# Patient Record
Sex: Female | Born: 1945 | Race: Black or African American | Hispanic: No | Marital: Married | Smoking: Former smoker
Health system: Southern US, Community
[De-identification: ages and names within clinical notes are randomized; demographics above are authoritative.]

## PROBLEM LIST (undated history)

## (undated) DIAGNOSIS — D569 Thalassemia, unspecified: Secondary | ICD-10-CM

## (undated) DIAGNOSIS — Z5189 Encounter for other specified aftercare: Secondary | ICD-10-CM

## (undated) DIAGNOSIS — E7800 Pure hypercholesterolemia, unspecified: Secondary | ICD-10-CM

## (undated) DIAGNOSIS — I10 Essential (primary) hypertension: Secondary | ICD-10-CM

## (undated) HISTORY — PX: BACK SURGERY: SHX140

## (undated) HISTORY — PX: CATARACT EXTRACTION: SUR2

---

## 2011-07-06 DIAGNOSIS — I671 Cerebral aneurysm, nonruptured: Secondary | ICD-10-CM | POA: Insufficient documentation

## 2012-09-20 DIAGNOSIS — K219 Gastro-esophageal reflux disease without esophagitis: Secondary | ICD-10-CM | POA: Insufficient documentation

## 2013-05-11 DIAGNOSIS — D638 Anemia in other chronic diseases classified elsewhere: Secondary | ICD-10-CM | POA: Insufficient documentation

## 2013-05-11 DIAGNOSIS — I10 Essential (primary) hypertension: Secondary | ICD-10-CM | POA: Insufficient documentation

## 2014-04-21 DIAGNOSIS — F419 Anxiety disorder, unspecified: Secondary | ICD-10-CM | POA: Insufficient documentation

## 2018-02-15 ENCOUNTER — Encounter (HOSPITAL_BASED_OUTPATIENT_CLINIC_OR_DEPARTMENT_OTHER): Payer: Self-pay | Admitting: Emergency Medicine

## 2018-02-15 ENCOUNTER — Emergency Department (HOSPITAL_BASED_OUTPATIENT_CLINIC_OR_DEPARTMENT_OTHER): Payer: Medicare Other

## 2018-02-15 ENCOUNTER — Emergency Department (HOSPITAL_BASED_OUTPATIENT_CLINIC_OR_DEPARTMENT_OTHER)
Admission: EM | Admit: 2018-02-15 | Discharge: 2018-02-15 | Disposition: A | Discharge status: Home / Self Care | Payer: Medicare Other | Attending: Emergency Medicine | Admitting: Emergency Medicine

## 2018-02-15 ENCOUNTER — Other Ambulatory Visit: Payer: Self-pay

## 2018-02-15 DIAGNOSIS — I10 Essential (primary) hypertension: Secondary | ICD-10-CM | POA: Insufficient documentation

## 2018-02-15 DIAGNOSIS — M79605 Pain in left leg: Secondary | ICD-10-CM | POA: Diagnosis present

## 2018-02-15 DIAGNOSIS — Z87891 Personal history of nicotine dependence: Secondary | ICD-10-CM | POA: Diagnosis not present

## 2018-02-15 DIAGNOSIS — M25562 Pain in left knee: Secondary | ICD-10-CM | POA: Diagnosis not present

## 2018-02-15 DIAGNOSIS — Z7982 Long term (current) use of aspirin: Secondary | ICD-10-CM | POA: Insufficient documentation

## 2018-02-15 HISTORY — DX: Encounter for other specified aftercare: Z51.89

## 2018-02-15 HISTORY — DX: Pure hypercholesterolemia, unspecified: E78.00

## 2018-02-15 HISTORY — DX: Thalassemia, unspecified: D56.9

## 2018-02-15 HISTORY — DX: Essential (primary) hypertension: I10

## 2018-02-15 MED ORDER — IBUPROFEN 200 MG PO TABS
200.0000 mg | ORAL_TABLET | Freq: Once | ORAL | Status: AC
  Administered 2018-02-15: 200 mg via ORAL
  Filled 2018-02-15: qty 1

## 2018-02-15 MED ORDER — ACETAMINOPHEN 325 MG PO TABS
650.0000 mg | ORAL_TABLET | Freq: Once | ORAL | Status: AC
  Administered 2018-02-15: 650 mg via ORAL
  Filled 2018-02-15: qty 2

## 2018-02-15 NOTE — ED Provider Notes (Signed)
[facility] EMERGENCY DEPARTMENT Provider Note   CSN: [id number] Arrival date & time: 02/15/18  1108     History   Chief Complaint Chief Complaint  Patient presents with  . Leg Pain    HPI Allison Schmidt is a 72 y.o. female.  Patient c/o left knee pain for the past few days. Pain constant, dull, moderate. With certain movements of knee, weight bearing the pain will shoot up and down. Denies leg or calf swelling. No skin changes, redness, or lesions. Denies low back or radicular pain. No hip pain. Denies fever or chills. Denies specific injury or strain. ?hx arthritis.   The history is provided by the patient.  Leg Pain   Pertinent negatives include no numbness.    Past Medical History:  Diagnosis Date  . Blood transfusion without reported diagnosis   . High cholesterol   . Hypertension   . Thalassemia     There are no active problems to display for this patient.   Past Surgical History:  Procedure Laterality Date  . BACK SURGERY    . CATARACT EXTRACTION       OB History   None      Home Medications    Prior to Admission medications   Medication Sig Start Date End Date Taking? Authorizing Provider  aspirin 81 MG chewable tablet Chew 81 mg by mouth daily.   Yes [provider]    Family History History reviewed. No pertinent family history.  Social History Social History   Tobacco Use  . Smoking status: Former Smoker    Types: Cigarettes  . Smokeless tobacco: Never Used  Substance Use Topics  . Alcohol use: Not Currently  . Drug use: Never     Allergies   Codeine   Review of Systems Review of Systems  Constitutional: Negative for fever.  Respiratory: Negative for shortness of breath.   Cardiovascular: Negative for chest pain.  Gastrointestinal: Negative for abdominal pain.  Musculoskeletal: Negative for back pain.  Skin: Negative for rash.  Neurological: Negative for weakness and numbness.     Physical Exam Updated  Vital Signs BP (!) 146/69 (BP Location: Left Arm)   Pulse (!) 59   Temp 97.8 F (36.6 C) (Oral)   Resp 18   Ht 1.676 m (5\' 6" )   Wt 59 kg   SpO2 99%   BMI 20.98 kg/m   Physical Exam  Constitutional: She appears well-developed and well-nourished.  HENT:  Head: Atraumatic.  Eyes: Conjunctivae are normal. No scleral icterus.  Neck: Neck supple. No tracheal deviation present.  Cardiovascular: Normal rate and intact distal pulses.  Pulmonary/Chest: Effort normal. No respiratory distress.  Abdominal: Normal appearance.  Musculoskeletal: She exhibits no edema.  Left knee w mild tenderness anteriorly and laterally. No effusion. Knee grossly stable. Some crepitus/clicking w rom left knee. No leg/calf swelling. Dp/pt 2+. Good passive  rom left hip without pain.   Neurological: She is alert.  Speech normal. Steady gait. LLE motor/sens grossly intact.   Skin: Skin is warm and dry. No rash noted.  Psychiatric: She has a normal mood and affect.  Nursing note and vitals reviewed.    ED Treatments / Results  Labs (all labs ordered are listed, but only abnormal results are displayed) Labs Reviewed - No data to display  EKG None  Radiology Dg Knee Complete 4 Views Left  Result Date: 02/15/2018 CLINICAL DATA:  Left knee and leg pain for the past 3 days. EXAM: LEFT KNEE - COMPLETE  4+ VIEW COMPARISON:  None. FINDINGS: No acute fracture or dislocation. No joint effusion. Minimal medial compartment joint space narrowing. Vascular calcifications. Soft tissues are unremarkable. IMPRESSION: No acute osseous abnormality or significant degenerative changes. Electronically Signed   By: Obie Dredge M.D.   On: 02/15/2018 12:35    Procedures Procedures (including critical care time)  Medications Ordered in ED Medications - No data to display   Initial Impression / Assessment and Plan / ED Course  I have reviewed the triage vital signs and the nursing notes.  Pertinent labs & imaging  results that were available during my care of the patient were reviewed by me and considered in my medical decision making (see chart for details).  Xrays.   Reviewed nursing notes and prior charts for additional history.   No meds pta.  Ibuprofen po. Acetaminophen po.  xrays reviewed - mild deg changes, neg acute.   Pt currently appears stable for d/c.  rec close pcp f/u.    Final Clinical Impressions(s) / ED Diagnoses   Final diagnoses:  None    ED Discharge Orders    None       Cathren Laine, MD 02/15/18 1255

## 2018-02-15 NOTE — ED Notes (Signed)
Patient transported to Ultrasound 

## 2018-02-15 NOTE — ED Triage Notes (Signed)
L leg pain behind the knee and up into the thigh x 3 days. Denies injury.

## 2018-02-15 NOTE — Discharge Instructions (Addendum)
It was our pleasure to provide your ER care today - we hope that you feel better.  Take acetaminophen and/or ibuprofen as need for pain.  Follow up with primary care doctor in 1 week.  Your blood pressure is high today - have your blood pressure rechecked at  follow up with primary care doctor.   Return to ER if worse, new symptoms, fevers, redness/swelling, other concern.

## 2020-11-09 DIAGNOSIS — G514 Facial myokymia: Secondary | ICD-10-CM | POA: Insufficient documentation

## 2021-03-10 ENCOUNTER — Emergency Department (HOSPITAL_BASED_OUTPATIENT_CLINIC_OR_DEPARTMENT_OTHER)
Admission: EM | Admit: 2021-03-10 | Discharge: 2021-03-10 | Disposition: A | Discharge status: Home / Self Care | Payer: Medicare Other | Attending: Emergency Medicine | Admitting: Emergency Medicine

## 2021-03-10 ENCOUNTER — Other Ambulatory Visit: Payer: Self-pay

## 2021-03-10 ENCOUNTER — Encounter (HOSPITAL_BASED_OUTPATIENT_CLINIC_OR_DEPARTMENT_OTHER): Payer: Self-pay | Admitting: *Deleted

## 2021-03-10 DIAGNOSIS — Z79899 Other long term (current) drug therapy: Secondary | ICD-10-CM | POA: Insufficient documentation

## 2021-03-10 DIAGNOSIS — Z7982 Long term (current) use of aspirin: Secondary | ICD-10-CM | POA: Diagnosis not present

## 2021-03-10 DIAGNOSIS — I10 Essential (primary) hypertension: Secondary | ICD-10-CM | POA: Diagnosis not present

## 2021-03-10 DIAGNOSIS — M542 Cervicalgia: Secondary | ICD-10-CM | POA: Diagnosis present

## 2021-03-10 DIAGNOSIS — Z87891 Personal history of nicotine dependence: Secondary | ICD-10-CM | POA: Insufficient documentation

## 2021-03-10 DIAGNOSIS — M25511 Pain in right shoulder: Secondary | ICD-10-CM | POA: Insufficient documentation

## 2021-03-10 DIAGNOSIS — M5412 Radiculopathy, cervical region: Secondary | ICD-10-CM | POA: Diagnosis not present

## 2021-03-10 MED ORDER — DOCUSATE SODIUM 100 MG PO CAPS: 100.0000 mg | ORAL_CAPSULE | Freq: Two times a day (BID) | ORAL | 0 refills | Status: AC

## 2021-03-10 MED ORDER — DIAZEPAM 5 MG/ML IJ SOLN
2.5000 mg | Freq: Once | INTRAMUSCULAR | Status: AC
  Administered 2021-03-10: 2.5 mg via INTRAVENOUS
  Filled 2021-03-10: qty 2

## 2021-03-10 MED ORDER — DEXAMETHASONE SODIUM PHOSPHATE 10 MG/ML IJ SOLN
6.0000 mg | Freq: Once | INTRAMUSCULAR | Status: AC
  Administered 2021-03-10: 6 mg via INTRAVENOUS
  Filled 2021-03-10: qty 1

## 2021-03-10 MED ORDER — FENTANYL CITRATE PF 50 MCG/ML IJ SOSY
50.0000 ug | PREFILLED_SYRINGE | Freq: Once | INTRAMUSCULAR | Status: AC
  Administered 2021-03-10: 50 ug via INTRAVENOUS
  Filled 2021-03-10: qty 1

## 2021-03-10 MED ORDER — HYDROCODONE-ACETAMINOPHEN 5-325 MG PO TABS: 1.0000 | ORAL_TABLET | Freq: Four times a day (QID) | ORAL | 0 refills | Status: DC | PRN

## 2021-03-10 NOTE — ED Triage Notes (Signed)
She woke with pain in her her right shoulder and right side of her neck. Limited movement. No known injury.

## 2021-03-10 NOTE — ED Provider Notes (Signed)
[facility] Provider Note   CSN: [id number] Arrival date & time: 03/10/21  1645     History Chief Complaint  Patient presents with   Shoulder Pain    Allison Schmidt is a 75 y.o. female.  Patient is a 75 year old female who presents with right-sided neck pain.  She says she woke up this morning with pain in the right side of her neck.  It radiates down to her right shoulder and a little bit down her right arm.  Also a little bit down her right side.  She has some intermittent tingling in her right hand.  No known weakness.  She says it hurts with movement.  She denies any recent injuries to her neck.  No history of similar symptoms in the past.  No associated chest pain or shortness of breath.  She says its been persistent throughout the day.  She tried some heating pads without improvement.      Past Medical History:  Diagnosis Date   Blood transfusion without reported diagnosis    High cholesterol    Hypertension    Thalassemia     There are no problems to display for this patient.   Past Surgical History:  Procedure Laterality Date   BACK SURGERY     CATARACT EXTRACTION       OB History   No obstetric history on file.     No family history on file.  Social History   Tobacco Use   Smoking status: Former    Types: Cigarettes   Smokeless tobacco: Never  Substance Use Topics   Alcohol use: Not Currently   Drug use: Never    Home Medications Prior to Admission medications   Medication Sig Start Date End Date Taking? Authorizing Provider  albuterol (VENTOLIN HFA) 108 (90 Base) MCG/ACT inhaler Inhale into the lungs. 01/12/21 01/12/22 Yes [provider]  ALPRAZolam Duanne Moron) 0.5 MG tablet Take one tablet on way to exam for anxiety. May repeat X 1 if still having anxiety. 08/29/20  Yes [provider]  azelastine (ASTELIN) 0.1 % nasal spray Place into the nose. 10/05/19  Yes [provider]   cetirizine-pseudoephedrine (ZYRTEC-D) 5-120 MG tablet Take by mouth. 10/05/19  Yes [provider]  docusate sodium (COLACE) 100 MG capsule Take 1 capsule (100 mg total) by mouth every 12 (twelve) hours. 03/10/21  Yes Malvin Johns, MD  ferrous sulfate 325 (65 FE) MG tablet Take 1 tablet by mouth daily with breakfast. 08/25/20  Yes [provider]  fluticasone (FLONASE) 50 MCG/ACT nasal spray Place into the nose. 04/29/20  Yes [provider]  gabapentin (NEURONTIN) 400 MG capsule Take 1 capsule by mouth 3 (three) times daily. 08/07/16  Yes [provider]  HYDROcodone-acetaminophen (NORCO/VICODIN) 5-325 MG tablet Take 1 tablet by mouth every 6 (six) hours as needed. 03/10/21  Yes Malvin Johns, MD  LORazepam (ATIVAN) 1 MG tablet 1 or 2 for MRI 11/09/20  Yes [provider]  losartan (COZAAR) 50 MG tablet Take 1 tablet by mouth daily. 10/03/20  Yes [provider]  montelukast (SINGULAIR) 10 MG tablet Take 1 tablet by mouth at bedtime. 10/01/16  Yes [provider]  mupirocin ointment (BACTROBAN) 2 % APPLY TO AFFECTED AREA TWICE DAILY TO NASAL PASSAGE FOR CRUSTING AS NEEDED 12/30/20  Yes [provider]  omeprazole (PRILOSEC) 40 MG capsule TAKE 1 CAPSULE BY MOUTH ONCE DAILY 30 MINUTES BEFORE BREAKFAST 09/17/16  Yes [provider]  potassium chloride  SA (KLOR-CON) 20 MEQ tablet Take 1 tablet by mouth daily. 11/12/16  Yes [provider]  simvastatin (ZOCOR) 40 MG tablet TAKE 1 TABLET BY MOUTH ONCE DAILY NIGHTLY 12/13/16  Yes [provider]  aspirin 81 MG chewable tablet Chew 81 mg by mouth daily.    [provider]  cinacalcet (SENSIPAR) 30 MG tablet Take by mouth.    [provider]  Multiple Vitamins-Minerals (CENTRAVITES) TABS Take 1 tablet by mouth daily.    [provider]  psyllium (KONSYL) 33 % POWD Take by mouth.    [provider]    Allergies     Codeine  Review of Systems   Review of Systems  Constitutional:  Negative for chills, diaphoresis, fatigue and fever.  HENT:  Negative for congestion, rhinorrhea and sneezing.   Eyes: Negative.   Respiratory:  Negative for cough, chest tightness and shortness of breath.   Cardiovascular:  Negative for chest pain and leg swelling.  Gastrointestinal:  Negative for abdominal pain, blood in stool, diarrhea, nausea and vomiting.  Genitourinary:  Negative for difficulty urinating, flank pain, frequency and hematuria.  Musculoskeletal:  Positive for neck pain. Negative for arthralgias and back pain.  Skin:  Negative for rash.  Neurological:  Negative for dizziness, speech difficulty, weakness, numbness and headaches.   Physical Exam Updated Vital Signs BP (!) 167/65   Pulse 68   Temp 98.1 F (36.7 C) (Oral)   Resp 18   Ht 5\' 6"  (1.676 m)   Wt 49.4 kg   SpO2 94%   BMI 17.59 kg/m   Physical Exam Constitutional:      Appearance: She is well-developed.  HENT:     Head: Normocephalic and atraumatic.  Eyes:     Pupils: Pupils are equal, round, and reactive to light.  Neck:     Comments: Positive tenderness over the right paracervical muscles and over the right trapezius muscle.  There is no specific tenderness over the right shoulder.  Radial pulses are intact.  She has normal sensation and motor function to the hand.  No swelling noted. Cardiovascular:     Rate and Rhythm: Normal rate and regular rhythm.     Heart sounds: Normal heart sounds.  Pulmonary:     Effort: Pulmonary effort is normal. No respiratory distress.     Breath sounds: Normal breath sounds. No wheezing or rales.  Chest:     Chest wall: No tenderness.  Abdominal:     General: Bowel sounds are normal.     Palpations: Abdomen is soft.     Tenderness: There is no abdominal tenderness. There is no guarding or rebound.  Musculoskeletal:        General: Normal range of motion.  Lymphadenopathy:     Cervical: No  cervical adenopathy.  Skin:    General: Skin is warm and dry.     Findings: No rash.  Neurological:     Mental Status: She is alert and oriented to person, place, and time.    ED Results / Procedures / Treatments   Labs (all labs ordered are listed, but only abnormal results are displayed) Labs Reviewed - No data to display  EKG None  Radiology No results found.  Procedures Procedures   Medications Ordered in ED Medications  fentaNYL (SUBLIMAZE) injection 50 mcg (50 mcg Intravenous Given by Other 03/10/21 1840)  dexamethasone (DECADRON) injection 6 mg (6 mg Intravenous Given by Other 03/10/21 1839)  diazepam (VALIUM) injection 2.5 mg (2.5 mg Intravenous  Given by Other 03/10/21 1840)    ED Course  I have reviewed the triage vital signs and the nursing notes.  Pertinent labs & imaging results that were available during my care of the patient were reviewed by me and considered in my medical decision making (see chart for details).    MDM Rules/Calculators/A&P                           Patient is a 75 year old female who presents with neck pain since she woke up this morning.  It is all on the right side.  Has some radiation into her right arm.  She has some tingling in her hand but no numbness or weakness on exam.  She does have some twitching of the left face and some drooping of the left face which is chronic.  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  She does not have any numbness or weakness on the right side.  No vision changes.  No headache.  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  However she does not have any symptoms today to suggest an issue with her aneurysm.  She had an MRI of the brain with contrast in May of this year which showed no evidence of aneurysms.  This was not specifically an MRA.  She denies any headache.  Her neck pain is paraspinal.  Seems to be radicular in nature.  There is no  neurologic deficits.  She feels better after treatment in the ED with Valium and fentanyl.  She is able to move it more.  She was also given a dose of Decadron.  She was discharged home in good condition.  She was given a prescription for small amount of Vicodin.  She was advised to follow-up with her doctor on Monday for recheck.  Return precautions were given. Final Clinical Impression(s) / ED Diagnoses Final diagnoses:  Cervical radiculopathy    Rx / DC Orders ED Discharge Orders          Ordered    HYDROcodone-acetaminophen (NORCO/VICODIN) 5-325 MG tablet  Every 6 hours PRN        03/10/21 2058    docusate sodium (COLACE) 100 MG capsule  Every 12 hours        03/10/21 2058             Rolan Bucco, MD 03/10/21 2105

## 2021-03-13 ENCOUNTER — Other Ambulatory Visit: Payer: Self-pay

## 2021-03-13 ENCOUNTER — Encounter (HOSPITAL_BASED_OUTPATIENT_CLINIC_OR_DEPARTMENT_OTHER): Payer: Self-pay | Admitting: *Deleted

## 2021-03-13 ENCOUNTER — Emergency Department (HOSPITAL_BASED_OUTPATIENT_CLINIC_OR_DEPARTMENT_OTHER)
Admission: EM | Admit: 2021-03-13 | Discharge: 2021-03-13 | Disposition: A | Discharge status: Home / Self Care | Payer: Medicare Other | Attending: Emergency Medicine | Admitting: Emergency Medicine

## 2021-03-13 ENCOUNTER — Emergency Department (HOSPITAL_BASED_OUTPATIENT_CLINIC_OR_DEPARTMENT_OTHER): Payer: Medicare Other

## 2021-03-13 DIAGNOSIS — I10 Essential (primary) hypertension: Secondary | ICD-10-CM | POA: Insufficient documentation

## 2021-03-13 DIAGNOSIS — R0789 Other chest pain: Secondary | ICD-10-CM | POA: Diagnosis not present

## 2021-03-13 DIAGNOSIS — M25511 Pain in right shoulder: Secondary | ICD-10-CM | POA: Insufficient documentation

## 2021-03-13 DIAGNOSIS — Z87891 Personal history of nicotine dependence: Secondary | ICD-10-CM | POA: Diagnosis not present

## 2021-03-13 DIAGNOSIS — M5412 Radiculopathy, cervical region: Secondary | ICD-10-CM | POA: Insufficient documentation

## 2021-03-13 DIAGNOSIS — Z79899 Other long term (current) drug therapy: Secondary | ICD-10-CM | POA: Diagnosis not present

## 2021-03-13 DIAGNOSIS — Z7982 Long term (current) use of aspirin: Secondary | ICD-10-CM | POA: Insufficient documentation

## 2021-03-13 DIAGNOSIS — M542 Cervicalgia: Secondary | ICD-10-CM | POA: Diagnosis present

## 2021-03-13 LAB — COMPREHENSIVE METABOLIC PANEL
ALT: 13 U/L (ref 0–44)
AST: 22 U/L (ref 15–41)
Albumin: 3.5 g/dL (ref 3.5–5.0)
Alkaline Phosphatase: 61 U/L (ref 38–126)
Anion gap: 9 (ref 5–15)
BUN: 15 mg/dL (ref 8–23)
CO2: 24 mmol/L (ref 22–32)
Calcium: 9.2 mg/dL (ref 8.9–10.3)
Chloride: 98 mmol/L (ref 98–111)
Creatinine, Ser: 0.87 mg/dL (ref 0.44–1.00)
GFR, Estimated: >60 mL/min (ref >60)
Glucose, Bld: 101 mg/dL — ABNORMAL HIGH (ref 70–99)
Potassium: 3.7 mmol/L (ref 3.5–5.1)
Sodium: 131 mmol/L — ABNORMAL LOW (ref 135–145)
Total Bilirubin: 0.7 mg/dL (ref 0.3–1.2)
Total Protein: 9 g/dL — ABNORMAL HIGH (ref 6.5–8.1)

## 2021-03-13 LAB — CBC WITH DIFFERENTIAL/PLATELET
Abs Immature Granulocytes: 0.05 10*3/uL (ref 0.00–0.07)
Basophils Absolute: 0 10*3/uL (ref 0.0–0.1)
Basophils Relative: 0 %
Eosinophils Absolute: 0 10*3/uL (ref 0.0–0.5)
Eosinophils Relative: 0 %
HCT: 31.6 % — ABNORMAL LOW (ref 36.0–46.0)
Hemoglobin: 10.5 g/dL — ABNORMAL LOW (ref 12.0–15.0)
Immature Granulocytes: 0 %
Lymphocytes Relative: 7 %
Lymphs Abs: 0.8 10*3/uL (ref 0.7–4.0)
MCH: 22.9 pg — ABNORMAL LOW (ref 26.0–34.0)
MCHC: 33.2 g/dL (ref 30.0–36.0)
MCV: 68.8 fL — ABNORMAL LOW (ref 80.0–100.0)
Monocytes Absolute: 1.1 10*3/uL — ABNORMAL HIGH (ref 0.1–1.0)
Monocytes Relative: 9 %
Neutro Abs: 10.1 10*3/uL — ABNORMAL HIGH (ref 1.7–7.7)
Neutrophils Relative %: 84 %
Platelets: 184 10*3/uL (ref 150–400)
RBC: 4.59 MIL/uL (ref 3.87–5.11)
RDW: 15.8 % — ABNORMAL HIGH (ref 11.5–15.5)
WBC: 12.1 10*3/uL — ABNORMAL HIGH (ref 4.0–10.5)
nRBC: 0 % (ref 0.0–0.2)

## 2021-03-13 LAB — TROPONIN I (HIGH SENSITIVITY): Troponin I (High Sensitivity): 5 ng/L (ref <18)

## 2021-03-13 LAB — MAGNESIUM: Magnesium: 1.7 mg/dL (ref 1.7–2.4)

## 2021-03-13 MED ORDER — KETOROLAC TROMETHAMINE 15 MG/ML IJ SOLN
15.0000 mg | Freq: Once | INTRAMUSCULAR | Status: AC
  Administered 2021-03-13: 11:00:00 15 mg via INTRAVENOUS
  Filled 2021-03-13: qty 1

## 2021-03-13 MED ORDER — MORPHINE SULFATE (PF) 4 MG/ML IV SOLN
4.0000 mg | Freq: Once | INTRAVENOUS | Status: AC
  Administered 2021-03-13: 11:00:00 4 mg via INTRAVENOUS
  Filled 2021-03-13: qty 1

## 2021-03-13 MED ORDER — LACTATED RINGERS IV BOLUS
500.0000 mL | Freq: Once | INTRAVENOUS | Status: AC
  Administered 2021-03-13: 11:00:00 500 mL via INTRAVENOUS

## 2021-03-13 MED ORDER — MELOXICAM 7.5 MG PO TABS: 7.5000 mg | ORAL_TABLET | Freq: Every day | ORAL | 0 refills | Status: AC

## 2021-03-13 MED ORDER — IOHEXOL 350 MG/ML SOLN
100.0000 mL | Freq: Once | INTRAVENOUS | Status: AC | PRN
  Administered 2021-03-13: 11:00:00 100 mL via INTRAVENOUS

## 2021-03-13 MED ORDER — METHOCARBAMOL 500 MG PO TABS: 500.0000 mg | ORAL_TABLET | Freq: Two times a day (BID) | ORAL | 0 refills | Status: AC

## 2021-03-13 NOTE — ED Provider Notes (Signed)
[facility]ENCY [facility]PARTMENT Provider Note   CSN: [id number] Arrival date & time: 03/13/21  0747     History Chief Complaint  Patient presents with   R shoulder pain    Allison Schmidt is a 75 y.o. female.  HPI Patient presents for right shoulder pain.  She was seen in the ED on Friday for similar symptoms.  At that time, she was given Valium, fentanyl, and Decadron.  She was prescribed Vicodin.  She reports no relief in her symptoms.  Throughout the weekend, she has been bedridden due to the pain.  At this time, pain is located in the right side of her neck and radiates into her shoulder, right scapular area, and right side of chest.  Pain is worsened with ipsilateral neck rotation as well as right arm movement.  She denies any pleurisy.  She denies any other areas of discomfort.  Patient denies any recent traumas.  Prior to Friday, she has not had any similar pain.    Past Medical History:  Diagnosis Date   Blood transfusion without reported diagnosis    High cholesterol    Hypertension    Thalassemia     There are no problems to display for this patient.   Past Surgical History:  Procedure Laterality Date   BACK SURGERY     CATARACT EXTRACTION       OB History   No obstetric history on file.     History reviewed. No pertinent family history.  Social History   Tobacco Use   Smoking status: Former    Types: Cigarettes   Smokeless tobacco: Never  Substance Use Topics   Alcohol use: Not Currently   Drug use: Never    Home Medications Prior to Admission medications   Medication Sig Start Date End Date Taking? Authorizing Provider  meloxicam (MOBIC) 7.5 MG tablet Take 1 tablet (7.5 mg total) by mouth daily for 10 days. 03/13/21 03/23/21 Yes Godfrey Pick, MD  methocarbamol (ROBAXIN) 500 MG tablet Take 1 tablet (500 mg total) by mouth 2 (two) times daily. 03/13/21  Yes Godfrey Pick, MD  albuterol (VENTOLIN HFA) 108 (90 Base) MCG/ACT inhaler Inhale into  the lungs. 01/12/21 01/12/22  [provider]  ALPRAZolam Duanne Moron) 0.5 MG tablet Take one tablet on way to exam for anxiety. May repeat X 1 if still having anxiety. 08/29/20   [provider]  aspirin 81 MG chewable tablet Chew 81 mg by mouth daily.    [provider]  azelastine (ASTELIN) 0.1 % nasal spray Place into the nose. 10/05/19   [provider]  cetirizine-pseudoephedrine (ZYRTEC-D) 5-120 MG tablet Take by mouth. 10/05/19   [provider]  cinacalcet (SENSIPAR) 30 MG tablet Take by mouth.    [provider]  docusate sodium (COLACE) 100 MG capsule Take 1 capsule (100 mg total) by mouth every 12 (twelve) hours. 03/10/21   Malvin Johns, MD  ferrous sulfate 325 (65 FE) MG tablet Take 1 tablet by mouth daily with breakfast. 08/25/20   [provider]  fluticasone (FLONASE) 50 MCG/ACT nasal spray Place into the nose. 04/29/20   [provider]  gabapentin (NEURONTIN) 400 MG capsule Take 1 capsule by mouth 3 (three) times daily. 08/07/16   [provider]  HYDROcodone-acetaminophen (NORCO/VICODIN) 5-325 MG tablet Take 1 tablet by mouth every 6 (six) hours as needed. 03/10/21   Malvin Johns, MD  LORazepam (ATIVAN) 1 MG tablet 1 or 2 for MRI 11/09/20   [provider]  losartan (COZAAR) 50 MG tablet Take 1 tablet by mouth daily. 10/03/20   [provider]  montelukast (SINGULAIR) 10 MG tablet Take 1 tablet by mouth at bedtime. 10/01/16   [provider]  Multiple Vitamins-Minerals (CENTRAVITES) TABS Take 1 tablet by mouth daily.    [provider]  mupirocin ointment (BACTROBAN) 2 % APPLY TO AFFECTED AREA TWICE DAILY TO NASAL PASSAGE FOR CRUSTING AS NEEDED 12/30/20   [provider]  omeprazole (PRILOSEC) 40 MG capsule TAKE 1 CAPSULE BY MOUTH ONCE DAILY 30 MINUTES BEFORE BREAKFAST 09/17/16   [provider]  potassium chloride SA (KLOR-CON) 20 MEQ tablet Take 1 tablet by  mouth daily. 11/12/16   [provider]  psyllium (KONSYL) 33 % POWD Take by mouth.    [provider]  simvastatin (ZOCOR) 40 MG tablet TAKE 1 TABLET BY MOUTH ONCE DAILY NIGHTLY 12/13/16   [provider]    Allergies    Codeine  Review of Systems   Review of Systems  Constitutional:  Negative for chills and fever.  HENT:  Negative for congestion, ear pain and sore throat.   Eyes:  Negative for pain and visual disturbance.  Respiratory:  Negative for cough, chest tightness, shortness of breath and wheezing.   Cardiovascular:  Negative for chest pain, palpitations and leg swelling.  Gastrointestinal:  Negative for abdominal pain, diarrhea, nausea and vomiting.  Genitourinary:  Negative for dysuria, flank pain and hematuria.  Musculoskeletal:  Positive for arthralgias and neck pain. Negative for back pain.  Skin:  Negative for color change, rash and wound.  Neurological:  Negative for dizziness, seizures, syncope, weakness, light-headedness, numbness and headaches.  Hematological:  Does not bruise/bleed easily.  Psychiatric/Behavioral:  Negative for confusion and decreased concentration.   All other systems reviewed and are negative.  Physical Exam Updated Vital Signs BP 140/62   Pulse 71   Resp 19   Ht 5\' 6"  (1.676 m)   Wt 49.4 kg   SpO2 98%   BMI 17.59 kg/m   Physical Exam Vitals and nursing note reviewed.  Constitutional:      General: She is not in acute distress.    Appearance: Normal appearance. She is well-developed and normal weight. She is not ill-appearing, toxic-appearing or diaphoretic.     Comments: Appears uncomfortable  HENT:     Head: Normocephalic and atraumatic.     Right Ear: External ear normal.     Left Ear: External ear normal.     Nose: Nose normal. No congestion.     Mouth/Throat:     Mouth: Mucous membranes are moist.     Pharynx: Oropharynx is clear.  Eyes:     General: No scleral icterus.    Extraocular Movements:  Extraocular movements intact.     Conjunctiva/sclera: Conjunctivae normal.  Cardiovascular:     Rate and Rhythm: Normal rate and regular rhythm.     Heart sounds: No murmur heard. Pulmonary:     Effort: Pulmonary effort is normal. No respiratory distress.     Breath sounds: Normal breath sounds. No wheezing or rales.  Chest:     Chest wall: Swelling and tenderness present.    Abdominal:     Palpations: Abdomen is soft.     Tenderness: There is no abdominal tenderness.  Musculoskeletal:        General: Swelling present. No tenderness.     Cervical back: Normal range of motion and neck supple. No tenderness.     Right  lower leg: No edema.     Left lower leg: No edema.  Skin:    General: Skin is warm and dry.     Capillary Refill: Capillary refill takes less than 2 seconds.     Coloration: Skin is not jaundiced or pale.  Neurological:     General: No focal deficit present.     Mental Status: She is alert and oriented to person, place, and time.     Cranial Nerves: No cranial nerve deficit.     Sensory: No sensory deficit.     Motor: No weakness.  Psychiatric:        Mood and Affect: Mood normal.        Behavior: Behavior normal.        Thought Content: Thought content normal.        Judgment: Judgment normal.    ED Results / Procedures / Treatments   Labs (all labs ordered are listed, but only abnormal results are displayed) Labs Reviewed  COMPREHENSIVE METABOLIC PANEL - Abnormal; Notable for the following components:      Result Value   Sodium 131 (*)    Glucose, Bld 101 (*)    Total Protein 9.0 (*)    All other components within normal limits  CBC WITH DIFFERENTIAL/PLATELET - Abnormal; Notable for the following components:   WBC 12.1 (*)    Hemoglobin 10.5 (*)    HCT 31.6 (*)    MCV 68.8 (*)    MCH 22.9 (*)    RDW 15.8 (*)    Neutro Abs 10.1 (*)    Monocytes Absolute 1.1 (*)    All other components within normal limits  MAGNESIUM  TROPONIN I (HIGH SENSITIVITY)   TROPONIN I (HIGH SENSITIVITY)    EKG EKG Interpretation  Date/Time:  Monday March 13 2021 08:10:17 EST Ventricular Rate:  75 PR Interval:  127 QRS Duration: 97 QT Interval:  410 QTC Calculation: 458 R Axis:   34 Text Interpretation: Sinus rhythm Left ventricular hypertrophy Borderline T abnormalities, anterior leads Confirmed by Gloris Manchester [phone]) on 03/13/2021 8:39:45 AM  Radiology CT Angio Chest PE W and/or Wo Contrast  Result Date: 03/13/2021 CLINICAL DATA:  Pulmonary emboli suspected, high probability. EXAM: CT ANGIOGRAPHY CHEST WITH CONTRAST TECHNIQUE: Multidetector CT imaging of the chest was performed using the standard protocol during bolus administration of intravenous contrast. Multiplanar CT image reconstructions and MIPs were obtained to evaluate the vascular anatomy. CONTRAST:  OMNIPAQUE IOHEXOL 350 MG/ML SOLN COMPARISON:  01/09/2021 FINDINGS: Cardiovascular: Mild cardiomegaly. Coronary artery calcification is visible. Aortic atherosclerotic calcification is present. Pulmonary arterial opacification is good. There are no pulmonary emboli. Mediastinum/Nodes: No mass or lymphadenopathy. Previous thyroidectomy on the left. Lungs/Pleura: Tiny amount of pleural fluid layering dependently on the left. Stable subpleural nodule in the right hilar region axial image 52 measures approximately 6 x 12 mm and is unchanged since at least September of 2020. The lungs are otherwise clear. Upper Abdomen: Negative Musculoskeletal: No acute or significant finding. Review of the MIP images confirms the above findings. IMPRESSION: Tiny pleural effusion on the left layering dependently. No pulmonary emboli or other acute chest pathology. Aortic Atherosclerosis (ICD10-I70.0). Electronically Signed   By: Paulina Fusi M.D.   On: 03/13/2021 10:59   CT Cervical Spine Wo Contrast  Result Date: 03/13/2021 CLINICAL DATA:  Cervical radiculopathy. Infection suspected. Continued worsening right-sided  neck pain with shoulder and arm pain. EXAM: CT CERVICAL SPINE WITHOUT CONTRAST TECHNIQUE: Multidetector CT imaging of the cervical spine  was performed without intravenous contrast. Multiplanar CT image reconstructions were also generated. COMPARISON:  MRI 11/29/2020 FINDINGS: Alignment: No malalignment. Mild straightening of the normal cervical lordosis. Skull base and vertebrae: No regional fracture or focal bone lesion. Soft tissues and spinal canal: Ordinary atherosclerotic calcification at the carotid bifurcations. Previous thyroidectomy on the left. Disc levels: Foramen magnum widely patent. Ordinary mild osteoarthritis at the C1-2 articulation but without encroachment upon the neural structures. C2-3: Anterior osteophytes.  No canal or foraminal stenosis. C3-4: Facet osteoarthritis on the right. Mild bulging of the disc. Mild right foraminal narrowing. Findings at this level could be a cause of right-sided neck pain. C4-5: Minimal spondylosis.  No stenosis. C5-6: Endplate osteophytes and mild bulging of the disc. No compressive stenosis. C6-7: Chronic solid bridging osteophytes. Wide patency of the canal and foramina. C7-T1: Normal interspace. 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 Mild bulging of the disc. Findings at this level could be a cause of right-sided neck pain. There would be some potential that the exiting right C4 nerve could be affected. Lesser, grossly non-compressive degenerative changes at the other levels as noted above. Electronically Signed   By: Nelson Chimes M.D.   On: 03/13/2021 11:06    Procedures Procedures   Medications Ordered in ED Medications  ketorolac (TORADOL) 15 MG/ML injection 15 mg (15 mg Intravenous Given 03/13/21 1105)  lactated ringers bolus 500 mL (0 mLs Intravenous Stopped 03/13/21 1146)  morphine 4 MG/ML injection 4 mg (4 mg Intravenous Given 03/13/21 1105)  iohexol (OMNIPAQUE) 350 MG/ML injection 100 mL (100 mLs  Intravenous Contrast Given 03/13/21 1039)    ED Course  I have reviewed the triage vital signs and the nursing notes.  Pertinent labs & imaging results that were available during my care of the patient were reviewed by me and considered in my medical decision making (see chart for details).    MDM Rules/Calculators/A&P                          Patient is a 75 year old female who presents for persistent pain that is located on the right side of her neck, right shoulder, and right side of chest.  On exam, she does have an area of swelling in the first sternocostal joint on the right side.  Toradol and morphine ordered for analgesia.  Patient undergo work-up to include CT imaging of chest and cervical spine.  On reassessment, patient reported significant improvement in her pain.  CT chest showed no acute findings on the right side.  CT of cervical spine did show right-sided facet osteoarthritis at C3-4 with mild bulging of disc.  Findings are consistent with cervical radiculopathy as the etiology of her pain.  She was advised to continue multimodal pain control at home.  Mobic and Robaxin were prescribed.  She is also advised to follow-up with neurosurgery for further evaluation.  She was discharged in stable condition.  Final Clinical Impression(s) / ED Diagnoses Final diagnoses:  Cervical radicular pain    Rx / DC Orders ED Discharge Orders          Ordered    methocarbamol (ROBAXIN) 500 MG tablet  2 times daily        03/13/21 1355    meloxicam (MOBIC) 7.5 MG tablet  Daily        03/13/21 1355             Godfrey Pick, MD 03/14/21 [phone]

## 2021-03-13 NOTE — Discharge Instructions (Addendum)
Prescriptions were sent to [facility] in [place].  1 of these is meloxicam.  This is an anti-inflammatory pain medication.  Take this instead of ibuprofen/Advil/Aleve.  The other medication is called methocarbamol.  This is a muscle relaxer.  This medication can cause drowsiness.  Take with caution.  Continue to take Tylenol.  Continue to take Norco only as needed.  Be especially cautious with taking Norco in combination with methocarbamol.  Call the number below to set up a follow-up appointment with neurosurgery.

## 2021-03-13 NOTE — ED Triage Notes (Signed)
BIB s.o. from home for continued/ worsening R sided neck, shoulder and arm pain, onset Friday morning upon waking. Seen Friday here in the ED for the same, given valium fentanyl and decadron with improvement. Dx'd with cervical radiculopthy. Vicodin not helping at home. Returns for worsening. Has not seen PCP for the same. Sees neurology, receives botox.

## 2021-03-21 ENCOUNTER — Other Ambulatory Visit: Payer: Self-pay | Admitting: Orthopedic Surgery

## 2021-03-21 DIAGNOSIS — M25511 Pain in right shoulder: Secondary | ICD-10-CM

## 2021-04-03 ENCOUNTER — Other Ambulatory Visit: Payer: Self-pay | Admitting: Orthopedic Surgery

## 2021-04-03 ENCOUNTER — Other Ambulatory Visit: Payer: Medicare Other

## 2021-04-03 ENCOUNTER — Ambulatory Visit
Admission: RE | Admit: 2021-04-03 | Discharge: 2021-04-03 | Disposition: A | Discharge status: Home / Self Care | Payer: Medicare Other | Source: Ambulatory Visit | Attending: Orthopedic Surgery | Admitting: Orthopedic Surgery

## 2021-04-03 DIAGNOSIS — M009 Pyogenic arthritis, unspecified: Secondary | ICD-10-CM

## 2021-04-03 MED ORDER — IOPAMIDOL (ISOVUE-300) INJECTION 61%
75.0000 mL | Freq: Once | INTRAVENOUS | Status: AC | PRN
  Administered 2021-04-03: 12:00:00 75 mL via INTRAVENOUS

## 2021-05-11 ENCOUNTER — Other Ambulatory Visit: Payer: Self-pay | Admitting: Orthopedic Surgery

## 2021-05-11 DIAGNOSIS — M86111 Other acute osteomyelitis, right shoulder: Secondary | ICD-10-CM

## 2021-05-18 ENCOUNTER — Other Ambulatory Visit: Payer: Self-pay | Admitting: Orthopedic Surgery

## 2021-05-18 DIAGNOSIS — M86111 Other acute osteomyelitis, right shoulder: Secondary | ICD-10-CM

## 2021-05-19 ENCOUNTER — Other Ambulatory Visit: Payer: Medicare Other

## 2021-05-26 ENCOUNTER — Ambulatory Visit
Admission: RE | Admit: 2021-05-26 | Discharge: 2021-05-26 | Disposition: A | Discharge status: Home / Self Care | Payer: Medicare Other | Source: Ambulatory Visit | Attending: Orthopedic Surgery | Admitting: Orthopedic Surgery

## 2021-05-26 DIAGNOSIS — M86111 Other acute osteomyelitis, right shoulder: Secondary | ICD-10-CM

## 2021-05-26 DIAGNOSIS — M86119 Other acute osteomyelitis, unspecified shoulder: Secondary | ICD-10-CM

## 2021-05-26 LAB — SYNOVIAL FLUID ANALYSIS, COMPLETE
Basophils, %: 0 %
Eosinophils-Synovial: 3 % — ABNORMAL HIGH (ref 0–2)
Lymphocytes-Synovial Fld: 15 % (ref 0–74)
Monocyte/Macrophage: 5 % (ref 0–69)
Neutrophil, Synovial: 77 % — ABNORMAL HIGH (ref 0–24)
Synoviocytes, %: 0 % (ref 0–15)
WBC, Synovial: 845 cells/uL — ABNORMAL HIGH (ref <150)

## 2021-06-01 LAB — ANAEROBIC AND AEROBIC CULTURE
AER RESULT:: NO GROWTH
GRAM STAIN:: NONE SEEN
MICRO NUMBER:: 12993177
MICRO NUMBER:: 12993178
SPECIMEN QUALITY:: ADEQUATE
SPECIMEN QUALITY:: ADEQUATE

## 2022-08-10 ENCOUNTER — Emergency Department (HOSPITAL_BASED_OUTPATIENT_CLINIC_OR_DEPARTMENT_OTHER)
Admission: EM | Admit: 2022-08-10 | Discharge: 2022-08-10 | Disposition: A | Discharge status: Home / Self Care | Payer: Medicare Other | Attending: Emergency Medicine | Admitting: Emergency Medicine

## 2022-08-10 ENCOUNTER — Other Ambulatory Visit: Payer: Self-pay

## 2022-08-10 ENCOUNTER — Encounter (HOSPITAL_BASED_OUTPATIENT_CLINIC_OR_DEPARTMENT_OTHER): Payer: Self-pay | Admitting: Urology

## 2022-08-10 DIAGNOSIS — Z7982 Long term (current) use of aspirin: Secondary | ICD-10-CM | POA: Insufficient documentation

## 2022-08-10 DIAGNOSIS — M545 Low back pain, unspecified: Secondary | ICD-10-CM

## 2022-08-10 DIAGNOSIS — M5416 Radiculopathy, lumbar region: Secondary | ICD-10-CM | POA: Insufficient documentation

## 2022-08-10 MED ORDER — KETOROLAC TROMETHAMINE 30 MG/ML IJ SOLN
30.0000 mg | Freq: Once | INTRAMUSCULAR | Status: AC
Start: 2022-08-10 — End: 2022-08-10
  Administered 2022-08-10: 30 mg via INTRAMUSCULAR
  Filled 2022-08-10: qty 1

## 2022-08-10 MED ORDER — IBUPROFEN 400 MG PO TABS: 400.0000 mg | ORAL_TABLET | Freq: Two times a day (BID) | ORAL | 0 refills | Status: AC | PRN

## 2022-08-10 MED ORDER — METHYLPREDNISOLONE 4 MG PO TBPK: ORAL_TABLET | ORAL | 0 refills | Status: DC

## 2022-08-10 MED ORDER — DEXAMETHASONE SODIUM PHOSPHATE 10 MG/ML IJ SOLN
6.0000 mg | Freq: Once | INTRAMUSCULAR | Status: AC
Start: 2022-08-10 — End: 2022-08-10
  Administered 2022-08-10: 6 mg via INTRAMUSCULAR
  Filled 2022-08-10: qty 1

## 2022-08-10 NOTE — ED Triage Notes (Signed)
Pt states left lower back pain radiating down left hip x 1 week  Pain worse with movement  Denies any urinary symptoms Pain with getting up from sitting position

## 2022-08-10 NOTE — ED Provider Notes (Signed)
EMERGENCY DEPARTMENT AT [facility] Provider Note   CSN: [id number] Arrival date & time: 08/10/22  1743     History  Chief Complaint  Patient presents with   Back Pain    Allison Schmidt is a 77 y.o. female with history of lumbar surgery presented to ED with complaint of low back pain rating down into her left leg.  Patient reports this pain has been going on for "a few weeks".  But has been getting worse the past few days.  She denies any urinary hesitation or saddle anesthesia.  She is able to ambulate.  Pain is worse with walking and movement.  She denies that she had any recent falls or injuries, including several weeks ago when her symptoms began.  She says she saw neurosurgeon nearly 10 years ago who did surgery on her lower back, but she has not had any persistent issues since then.  HPI     Home Medications Prior to Admission medications   Medication Sig Start Date End Date Taking? Authorizing Provider  ibuprofen (ADVIL) 400 MG tablet Take 1 tablet (400 mg total) by mouth every 12 (twelve) hours as needed for up to 20 days for moderate pain or mild pain. Take with food 08/10/22 08/30/22 Yes Adelaida Reindel, Kermit Balo, MD  methylPREDNISolone (MEDROL DOSEPAK) 4 MG TBPK tablet Take with breakfast 08/11/22  Yes Torrez Renfroe, Kermit Balo, MD  albuterol (VENTOLIN HFA) 108 (90 Base) MCG/ACT inhaler Inhale into the lungs. 01/12/21 01/12/22  [provider]  ALPRAZolam Prudy Feeler) 0.5 MG tablet Take one tablet on way to exam for anxiety. May repeat X 1 if still having anxiety. 08/29/20   [provider]  aspirin 81 MG chewable tablet Chew 81 mg by mouth daily.    [provider]  azelastine (ASTELIN) 0.1 % nasal spray Place into the nose. 10/05/19   [provider]  cetirizine-pseudoephedrine (ZYRTEC-D) 5-120 MG tablet Take by mouth. 10/05/19   [provider]  cinacalcet (SENSIPAR) 30 MG tablet Take by mouth.    [provider]  docusate  sodium (COLACE) 100 MG capsule Take 1 capsule (100 mg total) by mouth every 12 (twelve) hours. 03/10/21   Rolan Bucco, MD  ferrous sulfate 325 (65 FE) MG tablet Take 1 tablet by mouth daily with breakfast. 08/25/20   [provider]  fluticasone (FLONASE) 50 MCG/ACT nasal spray Place into the nose. 04/29/20   [provider]  gabapentin (NEURONTIN) 400 MG capsule Take 1 capsule by mouth 3 (three) times daily. 08/07/16   [provider]  HYDROcodone-acetaminophen (NORCO/VICODIN) 5-325 MG tablet Take 1 tablet by mouth every 6 (six) hours as needed. 03/10/21   Rolan Bucco, MD  LORazepam (ATIVAN) 1 MG tablet 1 or 2 for MRI 11/09/20   [provider]  losartan (COZAAR) 50 MG tablet Take 1 tablet by mouth daily. 10/03/20   [provider]  methocarbamol (ROBAXIN) 500 MG tablet Take 1 tablet (500 mg total) by mouth 2 (two) times daily. 03/13/21   Gloris Manchester, MD  montelukast (SINGULAIR) 10 MG tablet Take 1 tablet by mouth at bedtime. 10/01/16   [provider]  Multiple Vitamins-Minerals (CENTRAVITES) TABS Take 1 tablet by mouth daily.    [provider]  mupirocin ointment (BACTROBAN) 2 % APPLY TO AFFECTED AREA TWICE DAILY TO NASAL PASSAGE FOR CRUSTING AS NEEDED 12/30/20   [provider]  omeprazole (PRILOSEC) 40 MG capsule TAKE 1 CAPSULE BY MOUTH ONCE DAILY 30 MINUTES BEFORE BREAKFAST  09/17/16   [provider]  potassium chloride SA (KLOR-CON) 20 MEQ tablet Take 1 tablet by mouth daily. 11/12/16   [provider]  psyllium (KONSYL) 33 % POWD Take by mouth.    [provider]  simvastatin (ZOCOR) 40 MG tablet TAKE 1 TABLET BY MOUTH ONCE DAILY NIGHTLY 12/13/16   [provider]      Allergies    Codeine    Review of Systems   Review of Systems  Physical Exam Updated Vital Signs BP (!) 194/89 (BP Location: Left Arm)   Pulse 72   Temp 98.1 F (36.7 C) (Oral)   Resp 16   Ht 5\' 6"  (1.676 m)    Wt 45.8 kg   SpO2 99%   BMI 16.30 kg/m  Physical Exam Constitutional:      General: She is not in acute distress. HENT:     Head: Normocephalic and atraumatic.  Eyes:     Conjunctiva/sclera: Conjunctivae normal.     Pupils: Pupils are equal, round, and reactive to light.  Cardiovascular:     Rate and Rhythm: Normal rate and regular rhythm.  Pulmonary:     Effort: Pulmonary effort is normal. No respiratory distress.  Skin:    General: Skin is warm and dry.  Neurological:     General: No focal deficit present.     Mental Status: She is alert. Mental status is at baseline.     Sensory: No sensory deficit.     Motor: No weakness.  Psychiatric:        Mood and Affect: Mood normal.        Behavior: Behavior normal.     ED Results / Procedures / Treatments   Labs (all labs ordered are listed, but only abnormal results are displayed) Labs Reviewed - No data to display  EKG None  Radiology No results found.  Procedures Procedures    Medications Ordered in ED Medications  ketorolac (TORADOL) 30 MG/ML injection 30 mg (has no administration in time range)  dexamethasone (DECADRON) injection 6 mg (has no administration in time range)    ED Course/ Medical Decision Making/ A&P                             Medical Decision Making Risk Prescription drug management.   Patient is here with atraumatic lower back pain that radiates down into her left leg to approximately the knee, consistent with a possible L2 or L3 radiculopathy.  She does not have any motor weakness on exam.  No red flags for cauda equina syndrome.  No indication for emergent MRI imaging of the spinal cord, or x-ray or CT imaging, with no trauma to suggest fracture.  We had a long conversation about pain control at home.  She is ready on chronic gabapentin.  She is willing to try a course of steroids, says she is not diabetic.  Aside from that we can try occasional NSAIDs including Toradol here.  She reports  in the past she was told NSAIDs "make my blood pressure go up".  But there is no direct contraindication otherwise to NSAIDs.  Medications were given in the ED, and a short prescription was provided for home.  More pertinently advised the patient to contact her PCPs office or her spine surgeons office to arrange for follow-up on this issue. Her husband was also present for the exam.        Final Clinical Impression(s) /  ED Diagnoses Final diagnoses:  Acute left-sided low back pain, unspecified whether sciatica present  Lumbar radiculopathy    Rx / DC Orders ED Discharge Orders          Ordered    methylPREDNISolone (MEDROL DOSEPAK) 4 MG TBPK tablet        08/10/22 1819    ibuprofen (ADVIL) 400 MG tablet  Every 12 hours PRN        08/10/22 1819              Terald Sleeper, MD 08/10/22 1820

## 2022-08-10 NOTE — Discharge Instructions (Addendum)
Please schedule follow-up appointment at your doctor's office or at the spine surgeons office.  You may be experiencing pain from the nerves in your lower back, which can travel down into your legs.  I prescribed you a Medrol Dosepak which is a course of steroids.  You will take your next dose tomorrow morning with breakfast.  I also prescribed you a short course of ibuprofen medication.  Take this up to twice a day, but always with food.  If you begin to develop severe upset stomach, or indigestion, stop taking the ibuprofen.

## 2022-11-04 ENCOUNTER — Emergency Department (HOSPITAL_BASED_OUTPATIENT_CLINIC_OR_DEPARTMENT_OTHER): Payer: Medicare Other

## 2022-11-04 ENCOUNTER — Other Ambulatory Visit: Payer: Self-pay

## 2022-11-04 DIAGNOSIS — S9032XA Contusion of left foot, initial encounter: Secondary | ICD-10-CM | POA: Diagnosis not present

## 2022-11-04 DIAGNOSIS — I10 Essential (primary) hypertension: Secondary | ICD-10-CM | POA: Diagnosis not present

## 2022-11-04 DIAGNOSIS — Z7982 Long term (current) use of aspirin: Secondary | ICD-10-CM | POA: Diagnosis not present

## 2022-11-04 DIAGNOSIS — Z79899 Other long term (current) drug therapy: Secondary | ICD-10-CM | POA: Diagnosis not present

## 2022-11-04 DIAGNOSIS — W208XXA Other cause of strike by thrown, projected or falling object, initial encounter: Secondary | ICD-10-CM | POA: Insufficient documentation

## 2022-11-04 DIAGNOSIS — S99922A Unspecified injury of left foot, initial encounter: Secondary | ICD-10-CM | POA: Diagnosis present

## 2022-11-04 NOTE — ED Triage Notes (Addendum)
Pt dropped a cell phone on top of her left foot this am.  After church began hurting and pain has been increasing Has used ice on/off today Small area on top of foot that is swollen Painful to touch

## 2022-11-05 ENCOUNTER — Emergency Department (HOSPITAL_BASED_OUTPATIENT_CLINIC_OR_DEPARTMENT_OTHER)
Admission: EM | Admit: 2022-11-05 | Discharge: 2022-11-05 | Disposition: A | Discharge status: Home / Self Care | Payer: Medicare Other | Attending: Emergency Medicine | Admitting: Emergency Medicine

## 2022-11-05 DIAGNOSIS — S9032XA Contusion of left foot, initial encounter: Secondary | ICD-10-CM

## 2022-11-05 DIAGNOSIS — I10 Essential (primary) hypertension: Secondary | ICD-10-CM

## 2022-11-05 MED ORDER — TRAMADOL HCL 50 MG PO TABS: 50.0000 mg | ORAL_TABLET | Freq: Four times a day (QID) | ORAL | 0 refills | Status: DC | PRN

## 2022-11-05 MED ORDER — TRAMADOL HCL 50 MG PO TABS
50.0000 mg | ORAL_TABLET | Freq: Once | ORAL | Status: AC
Start: 2022-11-05 — End: 2022-11-05
  Administered 2022-11-05: 50 mg via ORAL
  Filled 2022-11-05: qty 1

## 2022-11-05 NOTE — Discharge Instructions (Addendum)
As we discussed, your x-ray did not show any fractures.  You likely have a foot contusion  Take Tylenol or Motrin for pain and take tramadol for severe pain  See your doctor for follow-up.  If you have persistent pain I recommend follow-up with orthopedic doctor  You can use the cam walker for comfort   Return to ER if you have worse foot pain, unable to walk

## 2022-11-05 NOTE — ED Provider Notes (Signed)
Allison Schmidt AT [facility] Provider Note   CSN: [id number] Arrival date & time: 11/04/22  2257     History  Chief Complaint  Patient presents with   Foot Pain    Allison Schmidt is a 77 y.o. female history of hypertension, spinal stenosis, here presenting with left foot pain.  Patient states that Allison Schmidt has not only dropped a cell phone onto her left foot this morning.  Allison Schmidt went to church and the pain became excruciating.  Patient also states that her left foot was swollen.  Patient denies any other injuries.  No meds prior to arrival.  The history is provided by the patient.       Home Medications Prior to Admission medications   Medication Sig Start Date End Date Taking? Authorizing Provider  albuterol (VENTOLIN HFA) 108 (90 Base) MCG/ACT inhaler Inhale into the lungs. 01/12/21 01/12/22  [provider]  ALPRAZolam Prudy Feeler) 0.5 MG tablet Take one tablet on way to exam for anxiety. May repeat X 1 if still having anxiety. 08/29/20   [provider]  aspirin 81 MG chewable tablet Chew 81 mg by mouth daily.    [provider]  azelastine (ASTELIN) 0.1 % nasal spray Place into the nose. 10/05/19   [provider]  cetirizine-pseudoephedrine (ZYRTEC-D) 5-120 MG tablet Take by mouth. 10/05/19   [provider]  cinacalcet (SENSIPAR) 30 MG tablet Take by mouth.    [provider]  docusate sodium (COLACE) 100 MG capsule Take 1 capsule (100 mg total) by mouth every 12 (twelve) hours. 03/10/21   Rolan Bucco, MD  ferrous sulfate 325 (65 FE) MG tablet Take 1 tablet by mouth daily with breakfast. 08/25/20   [provider]  fluticasone (FLONASE) 50 MCG/ACT nasal spray Place into the nose. 04/29/20   [provider]  gabapentin (NEURONTIN) 400 MG capsule Take 1 capsule by mouth 3 (three) times daily. 08/07/16   [provider]  HYDROcodone-acetaminophen (NORCO/VICODIN) 5-325 MG tablet Take 1  tablet by mouth every 6 (six) hours as needed. 03/10/21   Rolan Bucco, MD  LORazepam (ATIVAN) 1 MG tablet 1 or 2 for MRI 11/09/20   [provider]  losartan (COZAAR) 50 MG tablet Take 1 tablet by mouth daily. 10/03/20   [provider]  methocarbamol (ROBAXIN) 500 MG tablet Take 1 tablet (500 mg total) by mouth 2 (two) times daily. 03/13/21   Gloris Manchester, MD  methylPREDNISolone (MEDROL DOSEPAK) 4 MG TBPK tablet Take with breakfast 08/11/22   Terald Sleeper, MD  montelukast (SINGULAIR) 10 MG tablet Take 1 tablet by mouth at bedtime. 10/01/16   [provider]  Multiple Vitamins-Minerals (CENTRAVITES) TABS Take 1 tablet by mouth daily.    [provider]  mupirocin ointment (BACTROBAN) 2 % APPLY TO AFFECTED AREA TWICE DAILY TO NASAL PASSAGE FOR CRUSTING AS NEEDED 12/30/20   [provider]  omeprazole (PRILOSEC) 40 MG capsule TAKE 1 CAPSULE BY MOUTH ONCE DAILY 30 MINUTES BEFORE BREAKFAST 09/17/16   [provider]  potassium chloride SA (KLOR-CON) 20 MEQ tablet Take 1 tablet by mouth daily. 11/12/16   [provider]  psyllium (KONSYL) 33 % POWD Take by mouth.    [provider]  simvastatin (ZOCOR) 40 MG tablet TAKE 1 TABLET BY MOUTH ONCE DAILY NIGHTLY 12/13/16   [provider]      Allergies    Codeine    Review of Systems   Review of Systems  Musculoskeletal:  L foot pain   All other systems reviewed and are negative.   Physical Exam Updated Vital Signs BP (!) 195/75 (BP Location: Left Arm)   Pulse 64   Temp 97.8 F (36.6 C) (Tympanic)   Resp 18   Ht 5\' 5"  (1.651 m)   Wt 44.5 kg   SpO2 100%   BMI 16.31 kg/m  Physical Exam Vitals and nursing note reviewed.  Constitutional:      Appearance: Normal appearance.  HENT:     Head: Normocephalic and atraumatic.     Nose: Nose normal.     Mouth/Throat:     Mouth: Mucous membranes are moist.  Eyes:     Pupils: Pupils are equal, round, and  reactive to light.  Cardiovascular:     Rate and Rhythm: Normal rate.     Pulses: Normal pulses.  Pulmonary:     Effort: Pulmonary effort is normal.  Abdominal:     General: Abdomen is flat.  Musculoskeletal:     Cervical back: Normal range of motion.     Comments: Left foot with some tenderness over the midfoot.  Patient has no obvious deformity.  Able to wiggle toes.  Patient has 2+ DP pulse  Skin:    Capillary Refill: Capillary refill takes less than 2 seconds.  Neurological:     General: No focal deficit present.     Mental Status: Allison Schmidt is alert and oriented to person, place, and time.  Psychiatric:        Mood and Affect: Mood normal.        Behavior: Behavior normal.     ED Results / Procedures / Treatments   Labs (all labs ordered are listed, but only abnormal results are displayed) Labs Reviewed - No data to display  EKG None  Radiology DG Foot Complete Left  Result Date: 11/05/2022 CLINICAL DATA:  Blunt trauma to the foot with pain and swelling, initial encounter EXAM: LEFT FOOT - COMPLETE 3+ VIEW COMPARISON:  None Available. FINDINGS: Changes consistent with prior bunionectomy are noted. No acute fracture or dislocation is noted. No soft tissue changes are seen. IMPRESSION: Postsurgical changes without acute abnormality. Electronically Signed   By: Alcide Clever M.D.   On: 11/05/2022 00:19    Procedures Procedures    Medications Ordered in ED Medications  traMADol (ULTRAM) tablet 50 mg (has no administration in time range)    ED Course/ Medical Decision Making/ A&P                             Medical Decision Making Allison Schmidt is a 77 y.o. female here presenting with left foot injury.  I reviewed the x-ray and did not show any fracture.  Patient likely has a foot contusion.  Given cam walker for comfort.  Patient can follow-up with Ortho outpatient.  Patient request pain medicine and will prescribe short course of tramadol.  Of note patient is hypertensive  but has a history of hypertension I think her elevated blood pressure likely secondary to pain.   Problems Addressed: Contusion of left foot, initial encounter: acute illness or injury Hypertension, unspecified type: chronic illness or injury  Amount and/or Complexity of Data Reviewed Radiology: ordered and independent interpretation performed. Decision-making details documented in ED Course.  Risk Prescription drug management.    Final Clinical Impression(s) / ED Diagnoses Final diagnoses:  None    Rx / DC Orders ED Discharge Orders  None         Charlynne Pander, MD 11/05/22 [id number]

## 2023-08-08 DIAGNOSIS — I739 Peripheral vascular disease, unspecified: Secondary | ICD-10-CM | POA: Insufficient documentation

## 2023-10-24 IMAGING — CT CT CERVICAL SPINE W/O CM
3 of 4 series · 11 of 33 positions shown, 13 images · non-contrast
Comparison: MRI 11/29/2020

CLINICAL DATA: Cervical radiculopathy. Infection suspected.
Continued worsening right-sided neck pain with shoulder and arm
pain.

EXAM:
CT CERVICAL SPINE WITHOUT CONTRAST
TECHNIQUE: Multidetector CT imaging of the cervical spine was performed without
intravenous contrast. Multiplanar CT image reconstructions were also
generated.

[Series 5: sagittal bone · sagittal · 0.23mm/px · 5 of 61 slices shown, 6 images]
[im 21/61  bone]
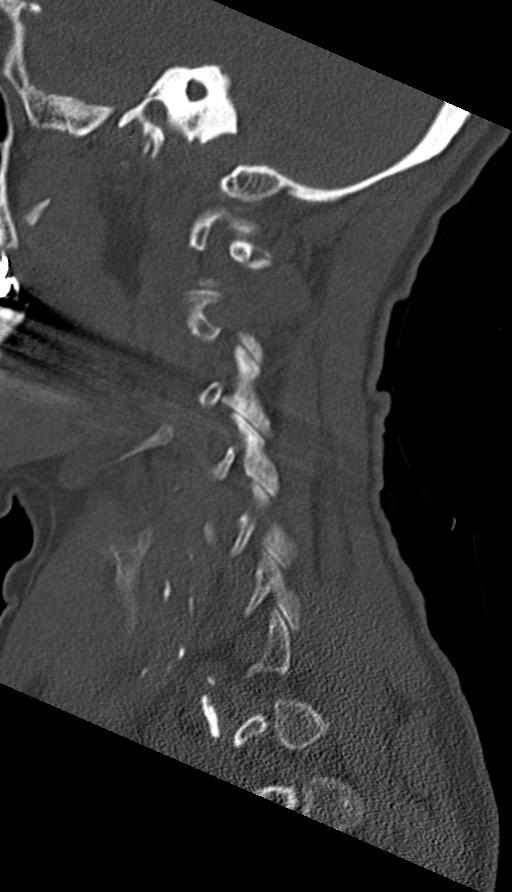
[im 26/61  bone]
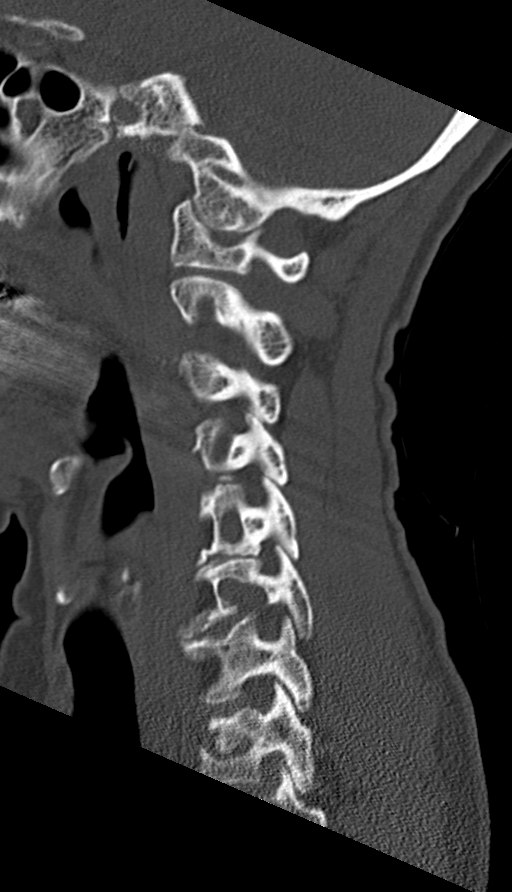
[im 31/61  soft-tissue]
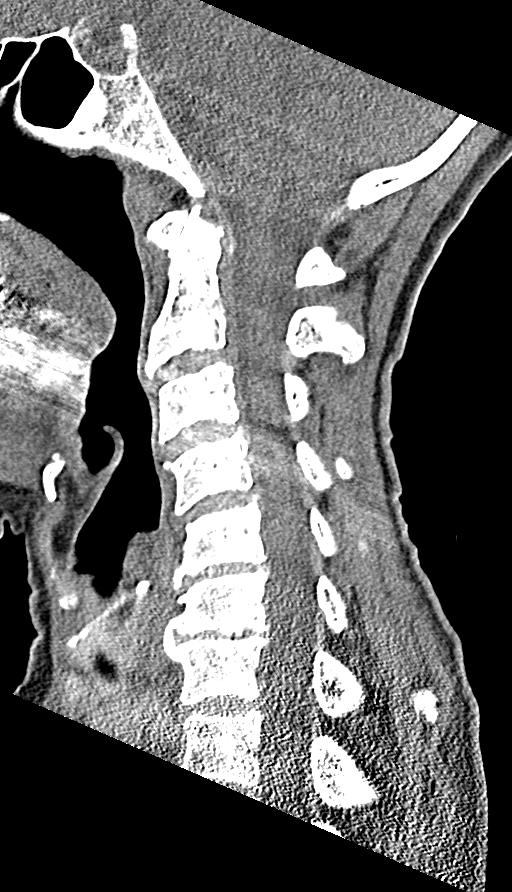
[im 31/61  bone]
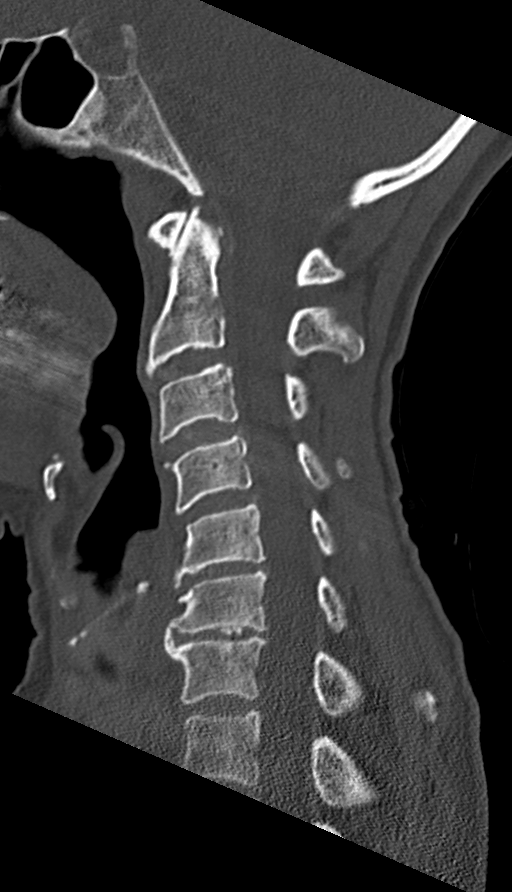
[im 36/61  bone]
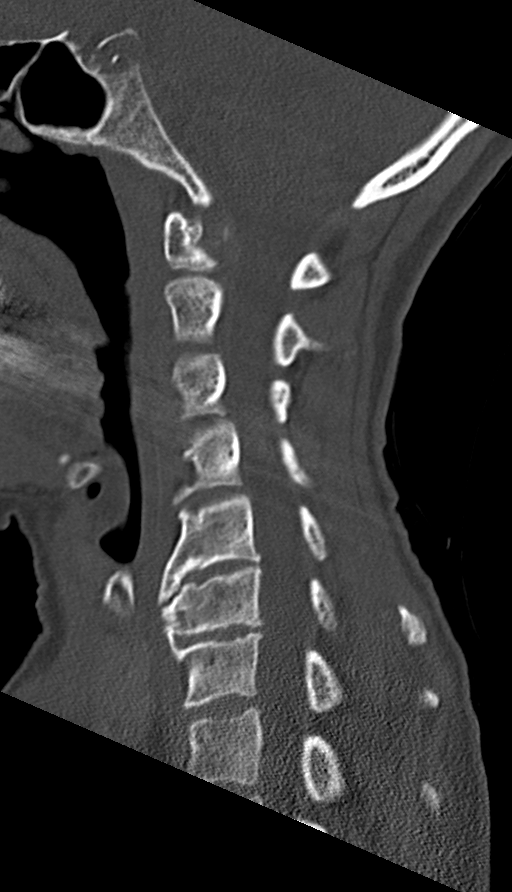
[im 41/61  bone]
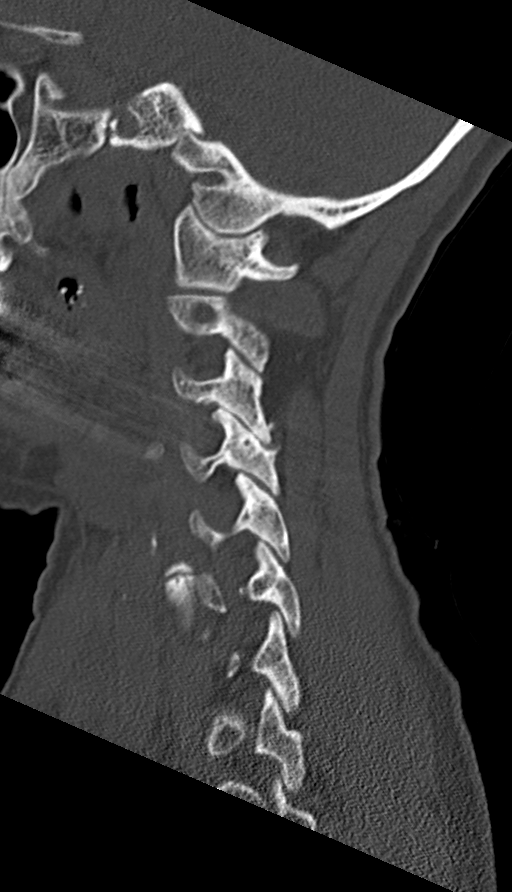

[Series 6: coronal bone · coronal · 0.26mm/px · 3 of 69 slices shown]
[im 19/69  bone]
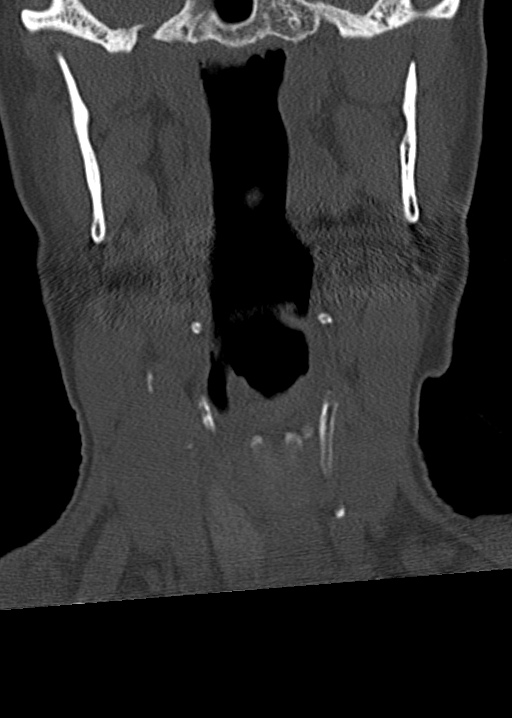
[im 29/69  bone]
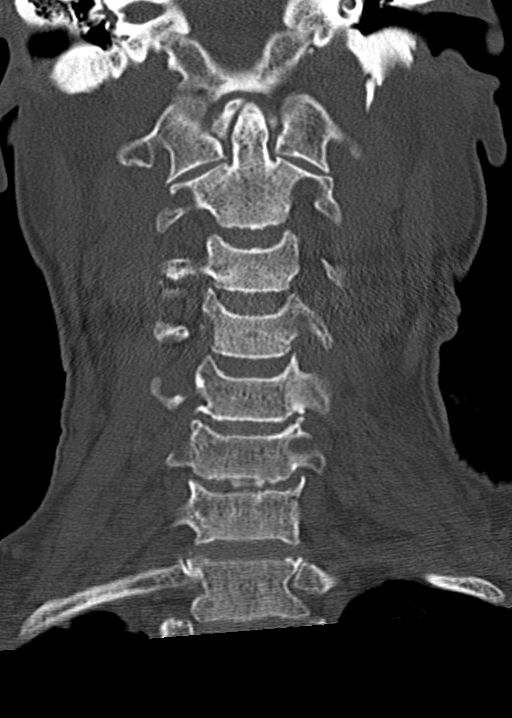
[im 40/69  bone]
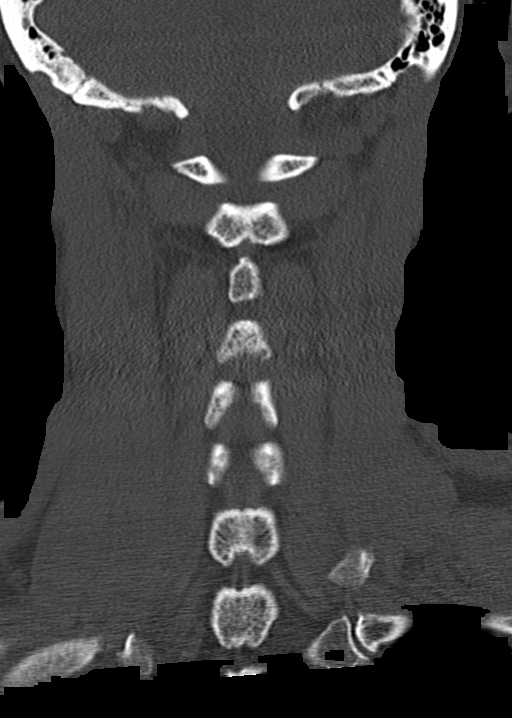

[Series 7: orthogonal bone · axial · 0.22mm/px · z∈[-162,-62]mm · 3 of 100 slices shown, 4 images]
[im 29/100  soft-tissue]
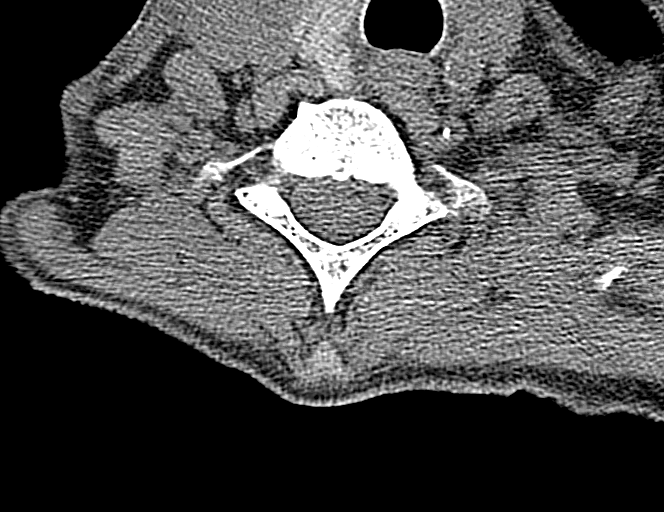
[im 29/100  bone]
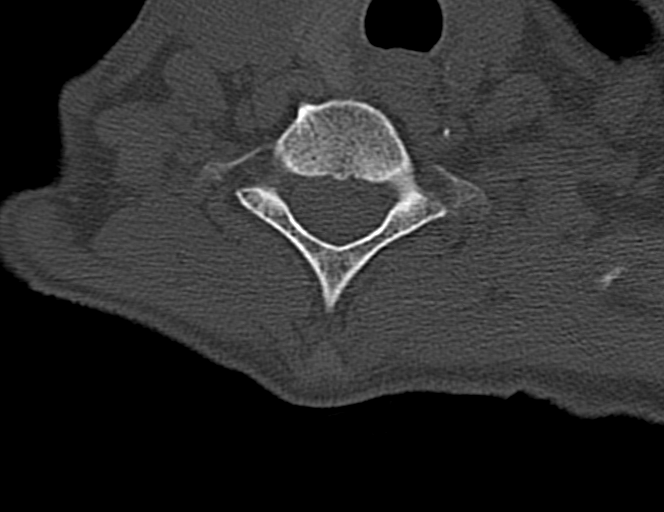
[im 57/100  bone]
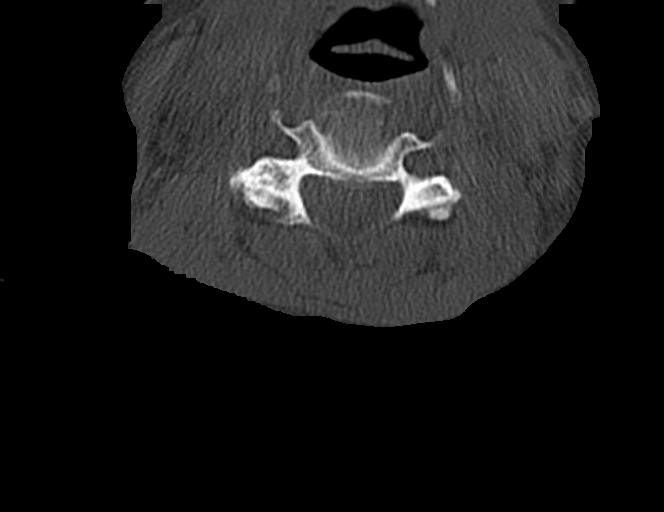
[im 85/100  bone]
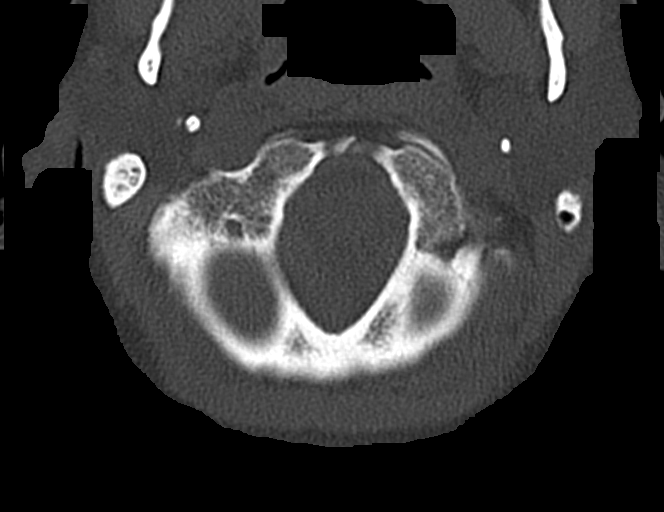

[11 of 33 positions shown; findings below may reference images not displayed]

FINDINGS: Alignment: No malalignment. Mild straightening of the normal
cervical lordosis.

Skull base and vertebrae: No regional fracture or focal bone lesion.

Soft tissues and spinal canal: Ordinary atherosclerotic
calcification at the carotid bifurcations. Previous thyroidectomy on
the left.

Disc levels: Foramen magnum widely patent. Ordinary mild
osteoarthritis at the C1-2 articulation but without encroachment
upon the neural structures.

C2-3: Anterior osteophytes.  No canal or foraminal stenosis.

C3-4: Facet osteoarthritis on the right. Mild bulging of the disc.
Mild right foraminal narrowing. Findings at this level could be a
cause of right-sided neck pain.

C4-5: Minimal spondylosis.  No stenosis.

C5-6: Endplate osteophytes and mild bulging of the disc. No
compressive stenosis.

C6-7: Chronic solid bridging osteophytes. Wide patency of the canal
and foramina.

C7-T1: Normal interspace.

Upper chest: Negative

Other: None
IMPRESSION: Right-sided facet osteoarthritis at C3-4. Mild bulging of the disc.
Findings at this level could be a cause of right-sided neck pain.
There would be some potential that the exiting right C4 nerve could
be affected.

Lesser, grossly non-compressive degenerative changes at the other
levels as noted above.

## 2023-10-24 IMAGING — CT CT ANGIO CHEST
2 of 8 series · 19 of 36 positions shown · IV contrast (omnipaque)
Comparison: 01/09/2021

CLINICAL DATA: Pulmonary emboli suspected, high probability.

EXAM:
CT ANGIOGRAPHY CHEST WITH CONTRAST
TECHNIQUE: Multidetector CT imaging of the chest was performed using the
standard protocol during bolus administration of intravenous
contrast. Multiplanar CT image reconstructions and MIPs were
obtained to evaluate the vascular anatomy.
CONTRAST:  100mL OMNIPAQUE IOHEXOL 350 MG/ML SOLN

[Series 6: pe thins · axial · 0.62mm/px · z∈[-318,-59]mm · 18 of 291 slices shown]
[im 16/291  lung]
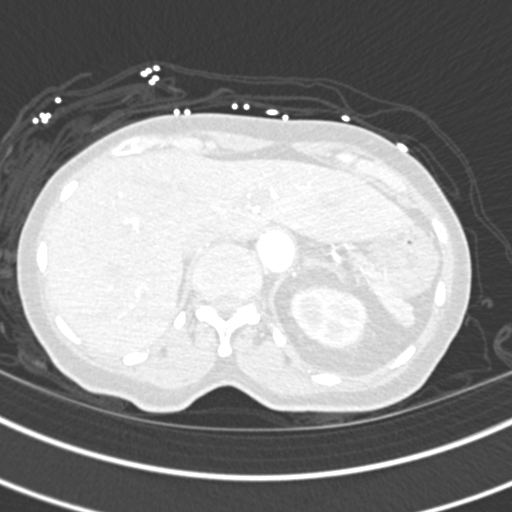
[im 31/291  mediastinal]
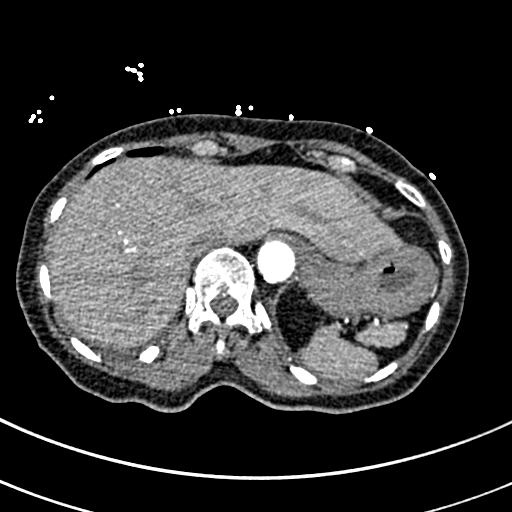
[im 46/291  lung]
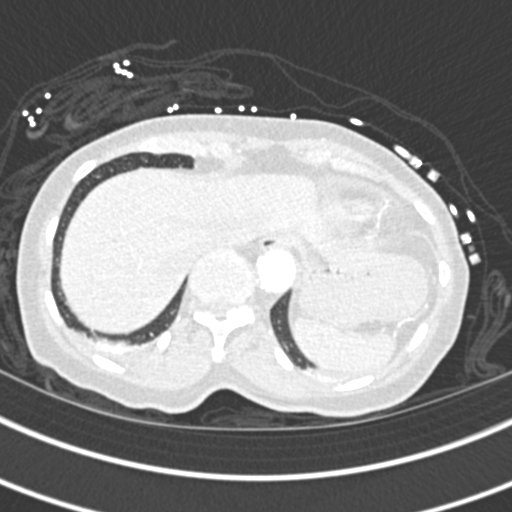
[im 62/291  mediastinal]
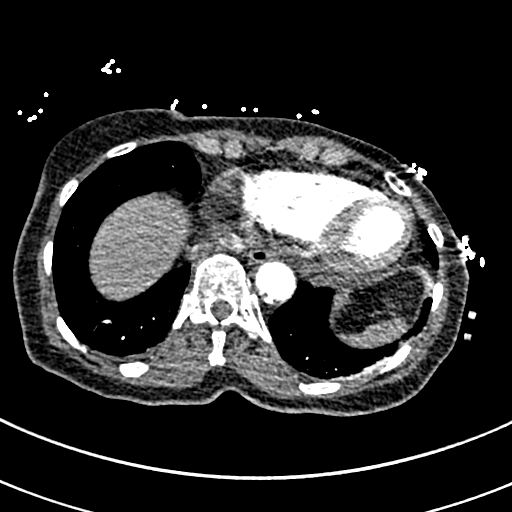
[im 77/291  lung]
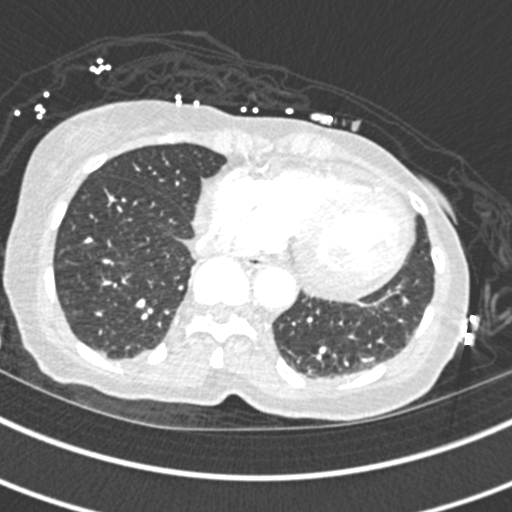
[im 92/291  mediastinal]
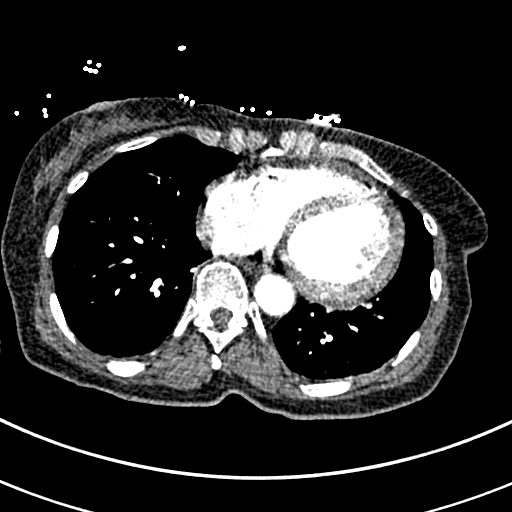
[im 107/291  lung]
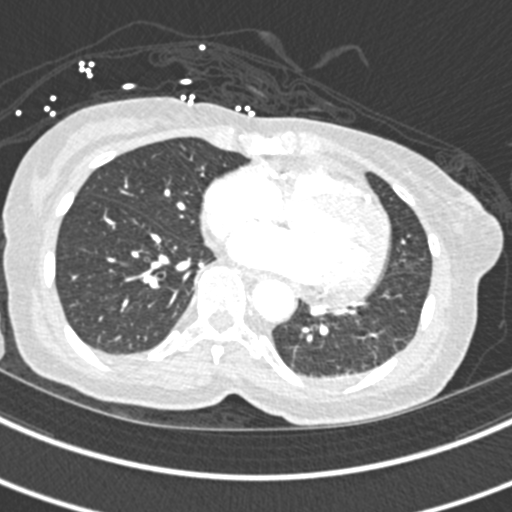
[im 123/291  mediastinal]
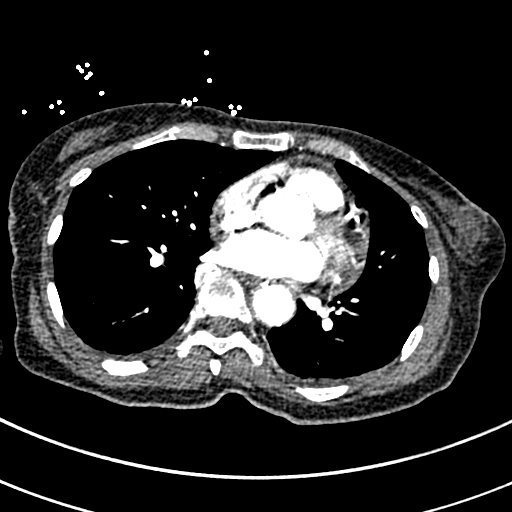
[im 138/291  lung]
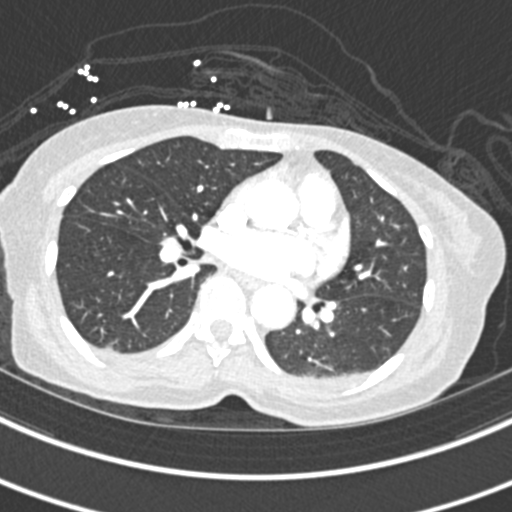
[im 153/291  mediastinal]
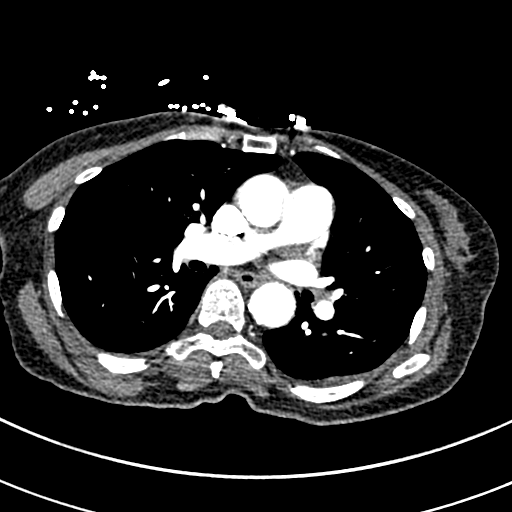
[im 168/291  lung]
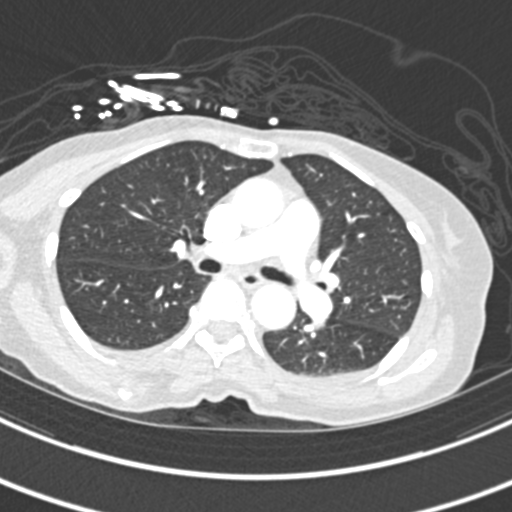
[im 184/291  mediastinal]
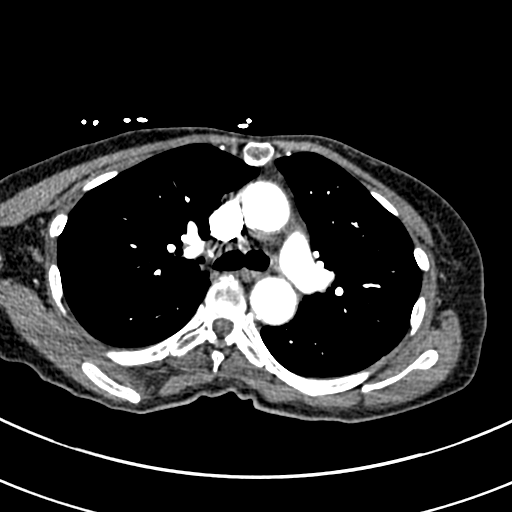
[im 199/291  lung]
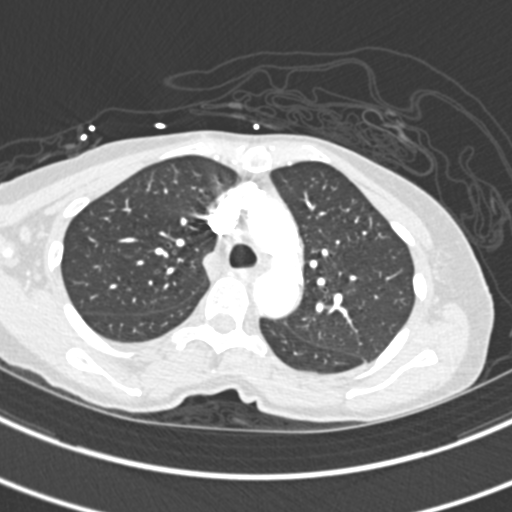
[im 214/291  mediastinal]
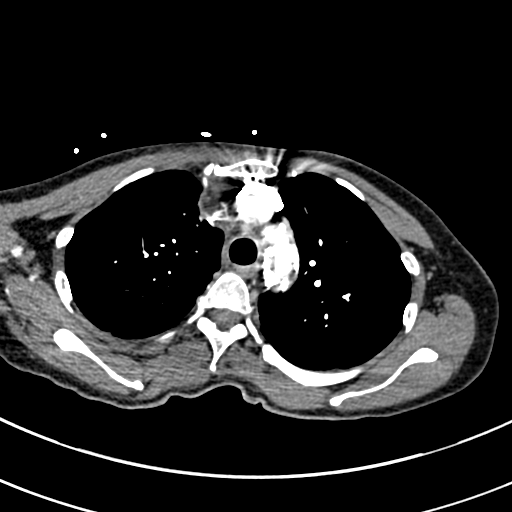
[im 229/291  lung]
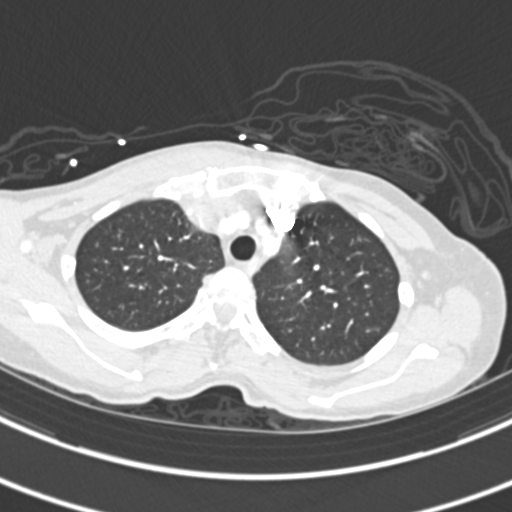
[im 245/291  mediastinal]
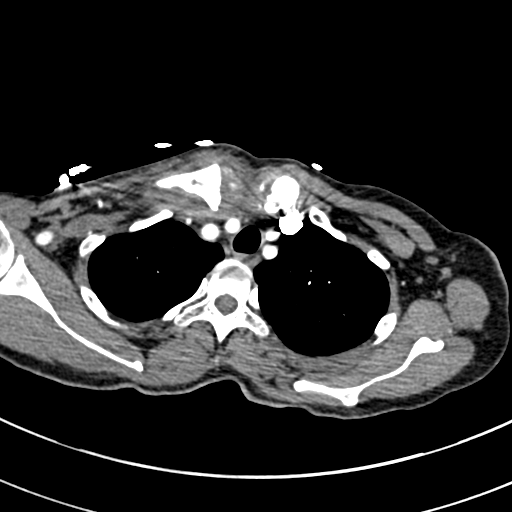
[im 260/291  lung]
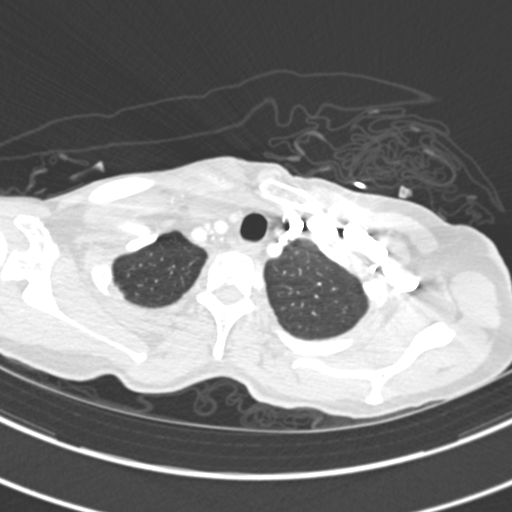
[im 275/291  mediastinal]
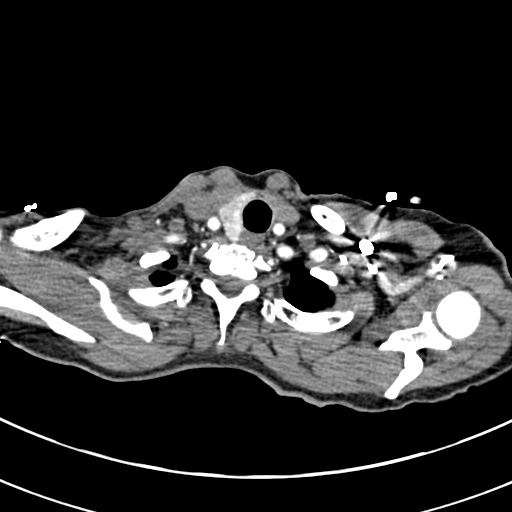

[Series 7: pe coronal mpr · coronal · 0.60mm/px · 1 of 90 slices shown]
[im 45/90  mediastinal]
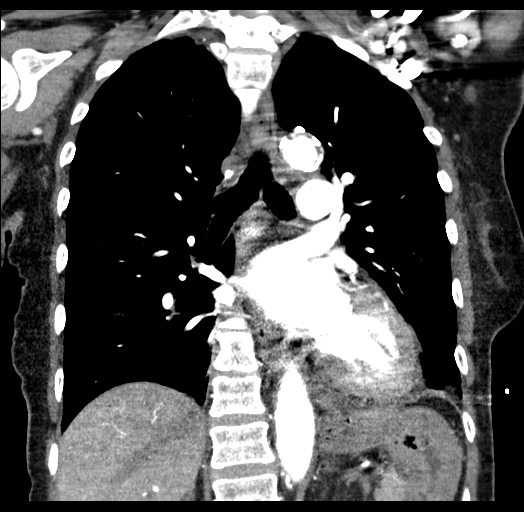

[19 of 36 positions shown; findings below may reference images not displayed]

FINDINGS: Cardiovascular: Mild cardiomegaly. Coronary artery calcification is
visible. Aortic atherosclerotic calcification is present. Pulmonary
arterial opacification is good. There are no pulmonary emboli.

Mediastinum/Nodes: No mass or lymphadenopathy. Previous
thyroidectomy on the left.

Lungs/Pleura: Tiny amount of pleural fluid layering dependently on
the left. Stable subpleural nodule in the right hilar region axial
image 52 measures approximately 6 x 12 mm and is unchanged since at
least Sunday December, 2018. The lungs are otherwise clear.

Upper Abdomen: Negative

Musculoskeletal: No acute or significant finding.

Review of the MIP images confirms the above findings.
IMPRESSION: Tiny pleural effusion on the left layering dependently. No pulmonary
emboli or other acute chest pathology.

Aortic Atherosclerosis ([id number]).

## 2024-02-06 DIAGNOSIS — I672 Cerebral atherosclerosis: Secondary | ICD-10-CM | POA: Insufficient documentation

## 2024-02-06 DIAGNOSIS — F419 Anxiety disorder, unspecified: Secondary | ICD-10-CM | POA: Insufficient documentation

## 2024-02-06 NOTE — Discharge Summary (Signed)
 [facility] Hospitalist Discharge Summary  Identifying Information:  Allison Schmidt 02/25/1946 [id number]  Admit date: 02/01/2024  Discharge date: 02/06/2024  Discharge Service: [facility] Hospitalist  Discharge Attending Physician:Eshwar Braxton, MD  Discharge to: Skilled nursing facility  Discharge Diagnoses: Principal Problem:   Toxic metabolic encephalopathy Active Problems:   Aneurysm of middle cerebral artery   Iron deficiency anemia   Anxiety and depression   Esophageal reflux   Essential hypertension   Hypercholesterolemia   Insomnia   Beta-thalassemia (HCC)   Vitamin D deficiency   PAD (peripheral artery disease)   Impaired mobility and ADLs    Hospital Course:  The patient is a 78 year old female who presents for evaluation of dementia. She has been experiencing symptoms of dementia for an extended period, characterized by memory loss. Her husband, who was present during the initial part of the visit, had to leave to attend to their grandson. She also reports experiencing headaches and pain during urination. Additionally, she notes that her urine appears unusually yellow in color. Despite these symptoms, she maintains that her dietary intake remains adequate.   Additional details obtained from patient's grandson He confirmed that she has not been eating or drinking well since last few days and she has been falling a lot and not following commands adequately as well as she has been peeing on herself regularly since last few days as well  Patient was admitted to hospital and found Foley problems,  Presumed toxic metabolic encephalopathy Acute stroke, POA. 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 Appreciate neurology consult.  Continue aspirin , statins and Plavix.  Follow-up neurology recommendations.  Follow-up pending workup.  We have repeated CT head yesterday due to worsening weakness/headache, negative for acute pathology.  Will decrease the dose of statins as patient is being more weak/tired.  Looks like patient is not able to tolerate higher dose of atorvastatin . Will continue aspirin , Plavix, atorvastatin  40 mg nightly as patient is not able to tolerate 80 mg and she became significantly weak. Mental status improving.   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 Anxiety and depression -Continue home medications Esophageal reflux -Continue home medications -Continue as needed Haldol. Essential hypertension - Blood pressure fluctuating.  Patient was taking very high dose carvedilol  at home and we have been holding it due to soft blood pressure.  Started on carvedilol  twice daily dose and blood pressure is still fluctuating.  Monitor blood pressure closely and adjust antihypertensive accordingly. Hypercholesterolemia -Lipitor Insomnia -Continue home medications Beta-thalassemia (HCC)   PAD (peripheral artery disease) Continue home medications    Hypercalcemia, improving.  Hold off calcium  supplement.  Status post IV fluid.     Hypokalemia.  Replaced and improved.  Patient seems to  be  stable for the discharge.  Patient has a high risk for readmission due to multiple medical problems.  Post Discharge Follow Up Issues:  I would like to request the physician and nursing home to follow the blood pressure and adjust antihypertensive accordingly.  Also would like to request for repeating blood work including BMP in 2 to 3 days and adjust electrolytes as needed.   Procedures: No admission procedures for hospital encounter. _____________________________________________________________________________ Discharge Day Services: BP (!) 166/70 (BP Location: Left arm, Patient Position: Lying)   Pulse 79   Temp 97.2 F (36.2 C) (Oral)   Resp 18   Ht 1.626 m (5' 4)   Wt 39.5 kg (87 lb 1.3 oz)   SpO2 100%   BMI 14.95 kg/m  Pt seen on the day of discharge and determined appropriate for discharge. General physical exam.  Patient is awake, alert and looks comfortable.  Condition at Discharge: fair  Length of Discharge: I spent 45 mins in the discharge of this patient. _____________________________________________________________________________ Discharge Medications: Patient Instructions:    Discharge Medications     PAUSE taking these medications      Sig Disp Refill Start End  Calcium  600 + D(3) 600 mg-10 mcg (400 unit) tablet Wait to take this until your doctor or other care provider tells you to start again. Generic drug: calcium  carbonate-vitamin D3  Take 1 tablet by mouth daily.   0     carvedilol  80 mg 24 hr capsule Wait to take this until your doctor or other care provider tells you to start again. Commonly known as: COREG  CR  Take 1 capsule by mouth once daily  90 capsule  2         New Medications      Sig Disp Refill Start End  carvediloL  12.5 mg tablet Commonly known as: COREG   Take 1 tablet (12.5 mg total) by mouth 2 (two) times a day.   0     ferrous sulfate 325 mg (65 mg iron) tablet  Take 1 tablet (325 mg total) by mouth daily before  breakfast.   0  February 07, 2024    loperamide 2 mg capsule Commonly known as: IMODIUM  Take 1 capsule (2 mg total) by mouth 4 (four) times a day as needed for diarrhea.   0         Medications To Continue      Sig Disp Refill Start End  acetaminophen  500 mg tablet Commonly known as: TYLENOL   Take 500 mg by mouth every 6 (six) hours as needed for mild pain (1-3).   0     alendronate 70 mg tablet Commonly known as: FOSAMAX  Take 1 tablet (70 mg total) by mouth once a week.  12 tablet  1     allopurinoL 100 mg tablet Commonly known as: ZYLOPRIM  Take 1 tablet by mouth once daily  90 tablet  0     aspirin  81 mg chewable tablet  Chew 81 mg daily.   0     atorvastatin  40 mg tablet Commonly known as: LIPITOR  Take one tablet (40 mg total) by mouth at bedtime.  90 tablet  3     Centravites 0.4-162-18 mg Tab Generic drug: multivitamin (adult)  Take 1 tablet by mouth daily.   0     cholecalciferol 1,000 unit (25 mcg) tablet Commonly known as: VITAMIN D3  Take 1,000 Units by mouth daily.   0     cinacalcet 30 mg  tablet Commonly known as: SENSIPAR  TAKE 1 TABLET BY MOUTH TWICE DAILY WITH FOOD OR  SHORTLY  AFTER  A  MEAL, SWALLOW TABLET WHOLE, DO NOT BREAK OR DIVIDE  60 tablet  5     clopidogreL 75 mg tablet Commonly known as: PLAVIX  Take one tablet (75 mg total) by mouth daily.  90 tablet  0     MetamuciL Fiber (aspartame) 3.4 gram packet Generic drug: psyllium  Take 1 packet by mouth daily.   0     pantoprazole  40 mg EC tablet Commonly known as: PROTONIX   Take 1 tablet (40 mg total) by mouth every morning before breakfast.  90 tablet  3          _____________________________________________________________________________ Pending Test Results (if blank, then none):    Most Recent Labs:     Lab Results  Component Value Date   WBC 3.80 (L) 02/04/2024   HGB 10.5 (L) 02/04/2024   HCT 32.1 (L) 02/04/2024   PLT 198 02/04/2024    Lab  Results  Component Value Date   CO2 28 02/04/2024   BUN 13 02/04/2024   GLUCOSE 98 02/04/2024   CREATININE 1.01 02/04/2024   CALCIUM  9.5 02/04/2024   ALBUMIN 3.6 02/01/2024   AST 28 02/01/2024   ALT 13 02/01/2024    Lab Results  Component Value Date   NA 140 02/04/2024   K 3.4 02/04/2024   CL 106 02/04/2024   CO2 28 02/04/2024   BUN 13 02/04/2024   CREATININE 1.01 02/04/2024   CALCIUM  9.5 02/04/2024   MG 2.1 02/04/2024   PHOS 2.8 08/26/2023    Lab Results  Component Value Date   BILITOT 0.5 02/01/2024   PROT 7.6 02/01/2024   ALBUMIN 3.6 02/01/2024   ALT 13 02/01/2024   AST 28 02/01/2024    No results found for: PT, INR, APTT Hospital Radiology:  CT Head WO Contrast Result Date: 02/04/2024 CT HEAD WITHOUT CONTRAST, 02/04/2024 1:15 PM INDICATION: Headache, new or worsening (Age >= 50y) COMPARISON: MRI 02/01/2024 TECHNIQUE: Axial CT images of the brain from skull base to vertex, including portions of the face and sinuses, were obtained without contrast. Supplemental 2D reformatted images were generated and reviewed as needed. All CT scans at [facility] and [facility] are performed using radiation dose optimization techniques as appropriate to a performed exam, including but not limited to one or more of the following: automatic exposure control, adjustment of the mA and/or kV according to patient size, use of iterative reconstruction technique. In addition, our institution participates in a radiation dose monitoring program to optimize patient radiation exposure. FINDINGS: Calvarium/skull base: No evidence of acute fracture or destructive lesion. Mastoids and middle ears demonstrate no substantial mucosal disease. Paranasal sinuses: No air fluid levels. Brain: A small focus of faint hypoattenuation in the subcortical white matter of the high right frontal lobe corresponds to area of restricted diffusion on comparison MRI. No evidence  for new acute large vessel territory infarct or infarct extension. No substantial mass effect or acute hemorrhage. Changes of chronic small vessel disease within the periventricular and subcortical white matter are unchanged. Small foci of hypoattenuation in the bilateral basal ganglia and left thalamus correspond to T2 hyperintensities on previous MRI, likely reflect a combination of dilated perivascular spaces and prior lacunar infarcts. Cerebral volume loss with ex vacuo enlargement of the ventricular system. Intracranial atherosclerosis.   No acute intracranial abnormality or substantial change in cross modality comparison  to MRI 02/01/2024. Consider MRI for further evaluation if there is ongoing clinical concern.   Transthoracic echo (TTE) complete Result Date: 02/03/2024                                                   [facility]                                                 [facility]                                                 [place]                                                   Medical                                                   Center                                                   Cardiac                                                 Ultrasound-                                                 [place],                                                [facility]                                                    Bldg                                                  [address]                                                 [place]                                                  [place] [address]                                  Transthoracic Echocardiogram Report Name  Allison Schmidt, Allison Schmidt                 Study Date  02-03-2024                    Height  64 in MRN  [id number]                              Patient Location  [facility]              Weight  87 lb DOB  23-Jun-1945                            Gender  Female                             BSA  1.4 m2 Age  78 yrs                                Ethnicity  3                              BP  167-83 mmHg Reason For Study  stroke History  stroke Ordering Physician  BRAXTON, ESHWAR            Performed By  SHADRICK, MAKAYLA Referring Physician  LAL, ESHWAR - - PROCEDURE A two-dimensional transthoracic echocardiogram with color flow and Doppler was performed. Image Quality  Technically adequate. - SUMMARY The left ventricular size is normal. Left ventricular systolic function is mildly reduced. LV ejection fraction = 40-45%. Left ventricular filling pattern is prolonged relaxation. The right ventricle is normal in size and function. There is mild to moderate tricuspid regurgitation. The inferior vena cava was not adequately visualized during the exam. There is small size pericardial effusion. Compared to the prior study, LV systolic function may be marginally decreased and the pericardial effusion may be slightly largeer, but probably no significant change overall. - FINDINGS LEFT VENTRICLE The left ventricular size is normal. There is mild concentric left ventricular hypertrophy. LV ejection fraction = 40-45%. Left ventricular systolic function is mildly reduced. Left ventricular filling pattern is prolonged relaxation. Unable to fully assess LV regional wall motion. - RIGHT VENTRICLE The right ventricle is normal in size and function. LEFT ATRIUM The left atrium is not well visualized. RIGHT ATRIUM Right atrial size is normal. - AORTIC VALVE The aortic valve is trileaflet. There is trivial aortic valve  thickening. The aortic valve opens well. There is no aortic stenosis. There is trace aortic regurgitation. - MITRAL VALVE The mitral valve leaflets appear normal. There is no mitral regurgitation noted. - TRICUSPID VALVE Structurally normal tricuspid valve. There is mild to moderate tricuspid regurgitation. RVSP not able to be calculated. - PULMONIC VALVE The pulmonic valve is not well visualized.  There is no pulmonic valvular regurgitation. There is no pulmonic valvular stenosis. - ARTERIES The aortic sinus is normal size. - VENOUS The inferior vena cava was not visualized during the exam. - EFFUSION There is small size pericardial effusion. There are no echocardiographic indications of cardiac tamponade. - - MMode-2D Measurements & Calculations IVSd  0.97 cm              LA dim  2.6 cm            ESV MOD-sp4         LVOT diam  2.0 cm LVIDd  4.4 cm              EDV MOD-sp4   67.2 ml     46.1 ml LVPWd  1.1 cm                                        EDV MOD-sp2  LVIDs  3.2 cm                                        65.8 ml                                                      ESV MOD-sp2                                                       34.1 ml           _________________________________________________________________________________ SV MOD-sp4   21.1 ml       EF A4C  31.4 %            LA ESV  BP          LA ESV Index  A2C  SI MOD-sp4   15.4 ml-m2                              23.6 ml             26.4 ml-m2           _________________________________________________________________________________ LA ESV Index  A4C          LA ESV Index  BP          SV A4C  21.1 ml 9.9 ml-m2                  17.2 ml-m2 Doppler Measurements & Calculations MV E max vel            MV dec time  0.18 sec  SV LVOT   63.0 ml       LV V1 VTI  20.0 cm 51.4 cm-sec                                           Ao V2 max  115.5 cm-sec MV A max vel                                          Ao max PG  5.3 mmHg 80.7 cm-sec                                           Ao V2 mean  73.7 cm-sec MV E-A  0.64                                          Ao mean PG  2.5 mmHg Med Peak E  Vel                                       Ao V2 VTI  21.4 cm 2.8 cm-sec Lat Peak E  Vel                                       AVA  VTI   2.9 cm2 4.1 cm-sec E-Lat E`  12.4 E-Med E`  18.2            _________________________________________________________________________________ TR max vel              AS Dimensionless Index  VTI   AVAi VTI  cm^2-m^2      SV index LVOT  256.7 cm-sec            0.94 TR max PG  26.4 mmHg                                  2.1 cm2                 45.9 ml-m2 ______________________________________________________________________________ Reading Physician                   MD Glendia Tanda Ee MD, [phone] 02-03-2024 06 02 PM  ECG 12 lead Result Date: 02/03/2024 Ventricular Rate                   73        BPM                 Atrial Rate                        73        BPM                 P-R Interval  130       ms                  QRS Duration                       100       ms                  Q-T Interval                       348       ms                  QTC Calculation Bazett             383       ms                  Calculated P Axis                  90        degrees             Calculated R Axis                  -30       degrees             Calculated T Axis                  24        degrees             Sinus rhythm Left axis deviation Possible LVH When compared with ECG of 24-Aug-2023 17 15, T wave inversion no longer evident in anterolateral leads Confirmed by Rojelio Dunnings  [phone]  on 02-03-2024 8 09 00 AM  MRI Brain WO Contrast Result Date: 02/02/2024 MRI BRAIN WITHOUT CONTRAST, 02/01/2024 10:56 PM INDICATION: Neuro deficit, acute, stroke suspected COMPARISON: Multiple priors, most recently CT 02/01/2024 TECHNIQUE: Multiplanar, multi-sequence magnetic resonance imaging of the brain was performed without contrast. FINDINGS: Calvarium/skull base: No focal marrow replacing lesion. Small volume of fluid in the right mastoid tip, nonspecific. Orbits: No mass lesion. Bilateral lens extractions. Paranasal sinuses: No air-fluid levels. Brain: Acute infarct in the high right frontal lobe. No mass effect. No evidence of acute hemorrhage. No  hydrocephalus. Periventricular white matter hypoattenuations, likely reflective of chronic microvascular ischemic changes. Global cerebral and cerebellar volume loss with ex-vacuo dilatation of the lateral ventricles. Intracranial atherosclerosis. Normal appearance of the major intracranial arteries and dural venous sinuses. Multiple bilateral cerebral microhemorrhages, similar to prior.   1.  Acute infarct in the high posterior right frontal lobe. 2.  Background of chronic microvascular disease and bilateral cerebral microhemorrhages.  XR Chest 1 View Result Date: 02/01/2024 XR CHEST 1 VIEW, 02/01/2024 2:43 PM INDICATION:ams COMPARISON: CT chest 01/24/2023 FINDINGS: Supportive devices: None. Cardiovascular/lungs/pleura: Cardiac silhouette and pulmonary vasculature are within normal limits. Aortic arch calcifications. Lungs are clear. No pleural effusion or pneumothorax. Other: Degenerative changes.   There is no evidence of acute cardiac or pulmonary abnormality.   CT Angio Head And Neck Result Date: 02/01/2024 CT BRAIN WITH AND WITHOUT CONTRAST, CT ANGIOGRAPHY OF NECK AND HEAD, 02/01/2024 4:19 PM INDICATION: ams; headache, dec ambulation COMPARISON: MRI brain 08/29/2020; CTA head 01/06/2019 TECHNIQUE: Scout and pre-contrast axial images were first obtained followed by a small test bolus of contrast for CTA timing purposes. Thereafter,  a full dose of iodinated contrast was administered intravenously by rapid injection, with further multi-slice axial sections acquired in the arterial phase from the aortic arch to the cranial vertex. Image post-processing was then performed using 3D techniques (for example, MIP or 3D surface-rendered) to create reformatted images for comprehensive analysis and diagnosis of the intracranial circulation including the Circle of Willis. Any stenosis reported is calculated using the estimated diameter of the distal normal vessel (e.g., ICA) in the denominator. All CT scans at  [facility] are performed using radiation dose optimization techniques as appropriate to a performed exam, including but not limited to one or more of the following: automatic exposure control, adjustment of the mA and/or kV according to patient size, use of iterative reconstruction technique. In addition, our institution participates in a radiation dose monitoring program to optimize patient radiation exposure. FINDINGS: CT HEAD: Calvarium/skull base: No evidence of acute fracture or destructive lesion. Mastoids and middle ears demonstrate no substantial mucosal disease. Paranasal sinuses: No air fluid levels. Brain: No acute large vascular territory infarct. No mass effect. No hydrocephalus. No acute hemorrhage. Patchy periventricular and subcortical white matter hypoattenuation which appears progressed in comparison to the prior MRI from 2022 and is most consistent with sequela of chronic microvascular ischemic disease. Calcified intracranial atherosclerosis. Basal ganglia calcifications. CTA NECK:  Aortic arch: Heavy calcified and noncalcified atherosclerosis of the aortic arch. There is mild narrowing of the origin of the left common carotid artery. Left carotid system: Multifocal mixed calcified and noncalcified atherosclerosis. No occlusion or significant stenosis (50% or greater). Right carotid system: Multifocal mixed calcified and noncalcified atherosclerosis. No occlusion or significant stenosis (50% or greater). Vertebral arteries: Codominant. Multifocal mixed calcified and noncalcified atherosclerosis. Moderate to severe narrowing of the V1 segment of the left vertebral artery. Tortuosity of the bilateral vertebral arteries which can be seen in chronic hypertension. CTA HEAD: Anterior circulation: There is calcific atherosclerosis of the bilateral carotid siphons. No occlusion or significant stenosis (50% or greater). Similar superiorly directed  saccular aneurysm arising from a proximal M2 branch of the right MCA measuring approximately 1-2 mm in size (series 12, image 106). Similar inferiorly directed conically shaped outpouching arising from the anterior communicating artery measuring approximately 1-2 mm in size (series 12 image 110). Posterior circulation: No occlusion or significant stenosis (50% or greater). No aneurysm. Venous structures: Opacified intracranial venous structures appear patent. ADDITIONAL FINDINGS: Nodular opacity in the right upper lobe measuring at least 1.1 cm in size. A few small hypoattenuating nodules in the right thyroid lobe which do not require specific imaging follow-up.   1.  No acute intracranial hemorrhage. 2.  Progressive patchy hypoattenuation in the periventricular and subcortical white matter, most conspicuous within the left frontal lobe. This is most consistent with sequela of progressive chronic microvascular ischemia; however, if there is clinical concern for acute infarct, MRI is recommended for further evaluation. 3.  No proximal large vessel intracranial arterial occlusion. 4.  Moderate to severe atherosclerotic narrowing of the V1 segment of the left vertebral artery in the neck. 5.  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 6.  Similar 1-2 mm inferiorly directed aneurysm versus infundibulum arising from the anterior communicating artery. 7.  Nodular opacity in the right upper lobe measuring at least 1.1 cm in size. A dedicated chest CT is recommended for further characterization. This may be infectious or inflammatory, though neoplastic disease  is difficult to completely exclude.    _____________________________________________________________________________ Discharge Instructions:   Discharge Orders     Ambulatory referral to Neurology     Details:    Reason for Referral: Stroke        Future Appointments  Date Time Provider  Department Center  02/10/2024  8:30 AM [facility] HEM ONC HP PERIPHERAL LAB [facility] HEMONC [facility] [place]  02/10/2024  9:00 AM Selinda Vicenta Albino, MD [facility] [place]  02/11/2024  8:30 AM Delon Torrance Nett, NP [facility] [place]  04/23/2024  8:00 AM [facility] 04 HC VASCULAR 2 [facility] [address]  04/23/2024  9:15 AM Charolet DELENA Ishikawa, MD [facility] [address] [facility]  06/02/2024  8:20 AM Elsie Bare Claudene Raddle., MD [facility] END CN None  06/22/2024  9:00 AM Tammy Rachel Brought, MD [facility] [id number]

## 2024-02-27 NOTE — Progress Notes (Addendum)
 " [facility] Pulmonary and Sleep Medicine   Name: Allison Schmidt MRN: [id number] DOB: 1945/08/28 Referring provider: Jolee Madelin Patch, MD  Chief Complaint   Chief Complaint  Patient presents with   Consult    For CT scan results. Pt denies any SOB.     Summary  Ms.Franzen is a 78 y.o. year-old female patient had a CT Angio Head & Neck scan on 02/01/2024 at the Emergency Department.   An incidental lung nodule was discovered, the largest being 11mm in the RUL.  This patient was previously seen by pulmonology but the last visit was in 2022.  Smoking history.  Worked in [employer].  No occupational exposure to any dangerous chemicals. Daughter passed of cancer.  No personal history of any cancer.  Subjective  Patient arrives with husband.  History of Present Illness The patient is a 78 year old female who presents as a referral for an incidental lung finding. She is accompanied by her husband.  She had a CT angiogram of her head and neck on 02/01/2024 in the emergency room, which revealed an incidental lung finding, the largest being 11 mm in the right upper lobe. A CT scan done on 01/11/2022 showed a pulmonary nodule measuring 10.3 mm, and a PET scan done in 2020 showed no metabolic activity. The patient has had a nodule in her right upper lobe measuring 1.1 cm this year, and in 2024, it was noted to be 0.5 mm.   She worked with [employer] and reports no chemical exposures. There is no family history of lung cancer and no personal history of cancer. She reports having iron deficiency anemia. She reports no symptoms such as shortness of breath or coughing. She has a history of smoking but quit in 2000 and has not experienced any coughing since then. She does not use any inhalers. She also reports no fevers, night sweats, or hemoptysis. Her sleep pattern is normal.  She has a history of allergic rhinitis, for which she was previously prescribed  Spiriva. However, she has not used this medication recently. She experiences dryness in her nasal passages, which occasionally leads to nosebleeds when she blows her nose. She has undergone nasal surgery in the past.  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 She suffered a stroke a few weeks ago. She has not had a mammogram yet.  SOCIAL HISTORY Marital Status: Married for 60 years Occupations: Worked with [employer] Tobacco: Smoked cigarettes, quit in 2000  FAMILY HISTORY - Negative for lung cancer. - Daughter: Breast cancer, metastasized to liver and bones, deceased.   Past Medical History  Medical History[1]   Past Surgical History  She  has a past surgical history that includes Thyroidectomy, partial; Turbinate resection (Bilateral, 05/17/2020); Lumbar laminectomy; Bunionectomy (Left); Cataract extraction w/  intraocular lens implant (Bilateral); Colonoscopy; Esophagogastroduodenoscopy; Femoral artery stent (Bilateral, 08/23/2023); and Iliac artery stent (N/A, 08/23/2023).  Social History  She  reports that she quit smoking about 16 years ago. Her smoking use included cigarettes. She started smoking about 63 years ago. She has a 47 pack-year smoking history. She has never been exposed to tobacco smoke. She has never used smokeless tobacco. She reports that she does not currently use drugs after having used the following drugs: Hydrocodone . She reports that she does not drink alcohol.  Family History  Her family history includes Breast cancer (age of onset: 83) in her daughter; Cerebral aneurysm in her brother  and mother; Dementia in her brother, mother, and sister; Stroke in her mother; Thyroid disease in her mother and sister.    Vital Signs  BP (!) 156/58 (BP Location: Right arm, Patient Position: Sitting)   Pulse 66   Temp 97.8 F (36.6 C) (Oral)   Resp 13   Ht 1.651 m (5' 5)   Wt 45.4 kg (100 lb)   SpO2 98%   BMI 16.64  kg/m   Review of Systems   Constitutional: Negative for fever HENT: Positive for congestion, negative post nasal drip, sinus pain and sore throat.  Eyes: Negative Respiratory: Negative for cough, shortness of breath and wheezing Endocrine: Negative Genitourinary: Negative Musculoskeletal: Negative Skin: Negative for rashes Allergic/Immunologic: Negative for environmental allergies, food allergies.  Negative for immunocompromised.  Neurological: Negative for weakness and headache Hematological: Negative Psychiatric/behavioral: Negative sleep disturbance   Physical Exam  General - pleasant HENT - no sinus tenderness, no oral exudate  Cardiac - regular rate and rhythm, no murmurs Chest - breath sounds equal bilaterally, no wheezing or rales Ext - no edema, cyanosis, or clubbing Skin - no rashes Psych - normal mood and behavior   Pulmonary Tests  Pulmonary Functions Testing Results:  FEV1  Date Value Ref Range Status  02/27/2024 1.78 L    FEV1/FVC  Date Value Ref Range Status  02/27/2024 72 %       Sleep Tests      Chest Imaging  CT Angio 02/01/2024 >> Nodular opacity in the right upper lobe measuring at least 1.1 cm in size.   CT chest 01/24/2023 >> New patchy tree-in-bud indistinct nodularity in the posterior right upper lobe measuring up to 0.5 cm. Tiny 0.2 cm solid peripheral right upper lobe nodule.    LDCT 01/11/2022>> Centrilobular emphysema. Pulmonary nodules measure  10.3 mm or less in size, as before.   PETCT 02/2019 >>  1. No metabolic activity associated with the RIGHT perihilar nodule favors benign etiology.   Cardiac Tests       Labs   Lab Results  Component Value Date   WBC 3.80 (L) 02/04/2024   HGB 10.5 (L) 02/04/2024   HCT 32.1 (L) 02/04/2024   PLT 198 02/04/2024   CHOL 166 02/01/2024   TRIG 47 02/01/2024   HDL 52 02/01/2024   ALT 13 02/01/2024   AST 28 02/01/2024   NA 140 02/04/2024   K 3.4 02/04/2024   CL 106 02/04/2024    CREATININE 1.01 02/04/2024   BUN 13 02/04/2024   CO2 28 02/04/2024   TSH 1.362 02/01/2024   VITD 56.3 02/01/2024    Micro/Sputum       Assessment/Plan   Assessment & Plan 1. Pulmonary nodule. -The patient has a history of a pulmonary nodule in the right upper lobe, which has shown growth from 0.5 mm in 2024 to 10.3 mm on 01/11/2022, and currently measures 11 mm as of 02/01/2024.  -A PET scan in 2020 showed no metabolic activity.  -Given the change in size and consistency, a repeat PET scan is necessary to rule out malignancy. The PET scan will be ordered, and the patient will return for a follow-up visit in approximately 4 weeks to discuss the results.  2. Allergic rhinitis. -The patient reports nasal dryness and occasional bleeding when blowing her nose.  -A prescription for Flonase will be provided to manage her symptoms. The patient will be advised to use the nasal spray to alleviate dryness and prevent bleeding.   3. Emphysema - She  has a history of using an inhaler for emphysema but found it ineffective so she stopped using it - No complaints of shortness of breath or wheezing -Spirometry normal today - We will continue to monitor.  Follow-up The patient will follow up in 4 weeks.   Plan was discussed with Dr. Nathanael as my supervising physician.   Patient Instructions   Patient Instructions  Orders placed:  - PET scan - Prescription sent to pharmacy for flonase  Keep up to date on your immunizations, especially influenza and pneumonia.  Follow up: 4 weeks  Thank you for allowing us  to take care of you today.   Hadassah Alt, MSN, FNP-C [facility] Pulmonary    Allergies/ Medications/ Immunizations   Allergies[2]    Medication List       * Accurate as of February 27, 2024  9:37 AM. If you have any questions, ask your nurse or doctor.          START taking these medications    fluticasone propionate 50 mcg/spray nasal  spray Commonly known as: FLONASE Administer 2 sprays into each nostril daily. Started by: Hadassah Alt, NP       CONTINUE taking these medications    acetaminophen  500 mg tablet Commonly known as: TYLENOL  Take 500 mg by mouth every 6 (six) hours as needed for mild pain (1-3).   alendronate 70 mg tablet Commonly known as: FOSAMAX Take 1 tablet (70 mg total) by mouth once a week.   allopurinoL 100 mg tablet Commonly known as: ZYLOPRIM Take 1 tablet (100 mg total) by mouth daily.   aspirin  81 mg chewable tablet Chew 81 mg daily.   atorvastatin  40 mg tablet Commonly known as: LIPITOR Take 1 tablet (40 mg total) by mouth at bedtime.   carvediloL  12.5 mg tablet Commonly known as: COREG  Take 1 tablet (12.5 mg total) by mouth 2 (two) times a day.   Centravites 0.4-162-18 mg Tab Generic drug: multivitamin (adult) Take 1 tablet by mouth daily.   cholecalciferol 1,000 unit (25 mcg) tablet Commonly known as: VITAMIN D3 Take 1,000 Units by mouth daily.   cinacalcet 30 mg tablet Commonly known as: SENSIPAR TAKE 1 TABLET BY MOUTH TWICE DAILY WITH FOOD OR  SHORTLY  AFTER  A  MEAL, SWALLOW TABLET WHOLE, DO NOT BREAK OR DIVIDE   clopidogreL 75 mg tablet Commonly known as: PLAVIX Take 1 tablet (75 mg total) by mouth daily.   donepeziL  5 mg tablet Commonly known as: ARICEPT  Take 1 tablet (5 mg total) by mouth nightly for 30 days, THEN 2 tablets (10 mg total) nightly. Start taking on: February 20, 2024   ferrous sulfate 325 mg (65 mg iron) tablet Take 1 tablet (325 mg total) by mouth daily before breakfast.   MetamuciL Fiber (aspartame) 3.4 gram packet Generic drug: psyllium Take 1 packet by mouth daily.   pantoprazole  40 mg EC tablet Commonly known as: PROTONIX  Take 1 tablet (40 mg total) by mouth every morning before breakfast.         Where to Get Your Medications     These medications were sent to [facility] [address] [place], [place] - [address] - PHONE: [phone] - FAX: [phone]  [address], [place] [address]    Phone: [phone]  fluticasone propionate 50 mcg/spray nasal spray     Immunization History  Administered Date(s) Administered   Covid-19 Vaccine Unspecified 01/19/2023, 02/27/2023   Influenza, High-dose Seasonal, Quadrivalent, Preservative  Free 02/03/2013, 03/01/2014, 02/15/2015, 01/19/2020, 01/05/2021, 12/23/2021, 04/18/2023   Influenza, Injectable, Quadrivalent, Preservative Free 02/12/2017, 01/17/2018, 12/17/2018   Influenza, Unspecified 02/20/2010, 02/22/2011, 01/15/2012, 01/13/2016   Influenza, high-dose, trivalent, PF 02/03/2013, 03/01/2014, 02/15/2015, 01/11/2023, 04/18/2023   Moderna Covid-19, mRNA,LNP-S,PF 12+ Yrs 01/23/2022   Pfizer SARS-CoV-2 Primary Series 12+ yrs 05/28/2019, 06/22/2019, 01/19/2020, 07/29/2020   Pneumococcal Conjugate 13-Valent 11/04/2014   Pneumococcal Conjugate Vaccine 20-Valent (PREVNAR-20) 6 wks+ 12/06/2021   Pneumococcal Polysaccharide Vaccine, 23 Valent (PNEUMOVAX-23) 2Y+ 01/15/2012   RSV IGIV 12/23/2021   Varicella Zoster [facility]) 18Y+ 07/29/2020, 11/18/2020   Zoster, Live 04/07/2012     Time Spent   On the day of the visit I spent 40 minutes preparing to see the patient, obtaining and/or reviewing separately obtained history, performing a medically appropriate examination and evaluation, counseling and educating the patient/caregiver, ordering medications, test, or procedures, and documenting clinical information in the electronic medical record.This time does not include any time spent performing procedures or assesments that are separately billable.     Signature  Hadassah Duwayne Alt, NP 02/27/2024, 9:37 AM  [address]   [address]   [address] [place], [place] [address] [place], [place] [address]  [place], [place] [address] [phone]  [phone]  [phone] -  2090  Completion of this note was preformed using [employer] software.       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   Arthralgia    Arthritis    AVM (arteriovenous malformation) of colon    Back pain    Beta thalassemia    (CMD)    Beta-thalassemia    (CMD) 02/01/2010   Bruises easily    Cervical spondylosis 05/18/2021   Chronic rhinitis 04/29/2020   DDD (degenerative disc disease), cervical    DDD (degenerative disc disease), lumbosacral    Degeneration of lumbar or lumbosacral intervertebral disc 11/20/2011   Degenerative disc disease, cervical 11/30/2020   Left C5-6   Diverticulosis    Diverticulosis of colon 09/20/2012   DOE (dyspnea on exertion)    Esophageal reflux 09/20/2012   Essential hypertension 05/11/2013   Facial twitching    left side   GERD (gastroesophageal reflux disease)    Gout    History of seasonal allergies    Hyperlipidemia    Hyperparathyroidism (CMD) 02/01/2022   02/01/22 Endo visit-She has history of hyperparathyroidism.  She has history of parathyroid surgery - most recently in 2018.Her calcium  increased again 4 months post surgery.She is taking cinacalcet 30 mg twice daily.     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

## 2024-03-11 NOTE — Progress Notes (Signed)
 "          [facility]   Name: Allison Schmidt Date of Birth: July 09, 1945 MRN: [id number]   Tumor Board Documentation:   Allison Schmidt was presented at our Tumor Board on 03/19/2024, which included representatives from Medical Oncology, Radiation Oncology, Surgical Oncology, Pathology, Navigation, Research, Radiology, Pulmonology, and Tumor Registry.   Allison Schmidt currently presents as new patient with history of the following treatments: None.   03/09/2024:  PET:  IMPRESSION: CONCLUSION: 1.  Minimally FDG avid cavitary pulmonary nodule in the right upper lobe, may reflect a slow-growing adenocarcinoma spectrum lesion. Slight prominent FDG uptake is also seen in the right suprahilar region, which may reflect a reactive or metastatic node. 2.  No other FDG avid mediastinal or hilar lymph nodes. 3.  No FDG avid distant metastatic disease. 4.  Reactive appearing right axillary lymph node.  02/01/2024:  CTA Head/Neck: IMPRESSION: 1.  No acute intracranial hemorrhage. 2.  Progressive patchy hypoattenuation in the periventricular and subcortical white matter, most conspicuous within the left frontal lobe. This is most consistent with sequela of progressive chronic microvascular ischemia; however, if there is clinical concern for acute infarct, MRI is recommended for further evaluation. 3.  No proximal large vessel intracranial arterial occlusion. 4.  Moderate to severe atherosclerotic narrowing of the V1 segment of the left vertebral artery in the neck. 5.  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 6.  Similar 1-2 mm inferiorly directed aneurysm versus infundibulum arising from the anterior communicating artery. 7.  Nodular opacity in the right upper lobe measuring at least 1.1 cm in size. A dedicated chest CT is recommended for further characterization. This may be infectious or inflammatory, though  neoplastic disease is difficult to completely exclude. 01/24/2023:   CT Chest: IMPRESSION: 1. New patchy tree-in-bud indistinct nodularity in the posterior right upper lobe measuring up to 0.5 cm, nonspecific but favoring a mild infectious or inflammatory bronchiolitis. Recommend attention on follow-up noncontrast chest CT in 3-6 months. 2. Chronic small pericardial effusion, similar. 3. Three-vessel coronary atherosclerosis. 4. Low cardiac blood pool density compatible with anemia. 5.  Aortic Atherosclerosis  01/11/2022:  LDCT Chest Annual: IMPRESSION:  1. Lung-RADS 2, benign appearance or behavior. Continue annual  screening with low-dose chest CT without contrast in 12 months.  2. Aortic atherosclerosis (ICD10-I70.0). Coronary artery  calcification.  3. Emphysema   04/03/2021:  CT Chest: IMPRESSION:  1. Synovitis and gas within along the right sternoclavicular joint  with adjacent inflammatory stranding tracking in the right upper  retrosternal region. Given the presence of the gas the appearance  favors septic arthritis of the right sternoclavicular joint. No  overt bony destructive findings at this time.  2. Bilateral sternoclavicular degenerative arthropathy.  3. Other imaging findings of potential clinical significance: Aortic  Atherosclerosis (ICD10-I70.0). Coronary atherosclerosis. Absent left  thyroid lobe. Small hypodense hepatic and renal lesions are probably  benign cysts but technically nonspecific. Anterolisthesis at L2-3  with probable bilateral foraminal stenosis at this level.   Additionally, we reviewed previous medical and familial history, history of present illness, and recent lab results along with all available histopathologic and imaging studies. The tumor board considered available treatment options and made the following recommendations:  Plan:  TOPS referral for bronchoscopy consult for biopsy of RUL nodule.  Clinical Trial Status: Not discussed    National site-specific guidelines were discussed with respect to the case.   Tumor  board is a meeting of clinicians from various specialty areas who evaluate and discuss patients for whom a multidisciplinary approach is being considered. Final determinations in the plan of care are those of the provider(s). The responsibility for follow up of recommendations given during tumor board is that of the provider.    Today's extended care, comprehensive team conference did not exceed 30 minutes. Jakirah Zaun was not present for the discussion and was not examined.   "

## 2024-03-11 NOTE — Progress Notes (Signed)
 Hey Cheridan,  I wanted to send you this patient and if you would be able to reschedule her apt? It looks like she cancelled 11.13.   Patient Communication: Contact Patient  Request: Schedule appt for pulm apt f/u in [place] with [facility] to discuss recent PET scan. Date Preference: 4 weeks - in December Appt Notes: ILN 11mm single solid RUL. Patient previously LS patient and previously seeing Dr. Arlina in 2022. Other Details: referral placed by PCP  - Dr. Jolee     Thanks

## 2024-03-11 NOTE — Progress Notes (Signed)
 I just saw she is going to be discussed @Tumor  Board!

## 2024-03-18 NOTE — Telephone Encounter (Signed)
LMTRC regarding below.

## 2024-03-18 NOTE — Telephone Encounter (Signed)
 Also requesting a call back from Dr. Jolee for something private.

## 2024-03-18 NOTE — Telephone Encounter (Signed)
 Copied from CRM #[id number]. Topic: Referral - New Referral/Order Request >> Mar 18, 2024  8:18 AM Geni GRADE wrote: Zachary Drilling Spouse is calling other request    Include all details related to the request(s) below: Calling to request a referral for home health services to help with daily task. Did try to schedule an appointment. First appointment is in March.     Confirm and type the Best Contact Number below:  Patient/caller contact number:   [phone]          [] Home  [x] Mobile  [] Work [] Other   [x] Okay to leave a voicemail   Medication List:  Current Outpatient Medications:    acetaminophen  (TYLENOL ) 500 mg tablet, Take 500 mg by mouth every 6 (six) hours as needed for mild pain (1-3)., Disp: , Rfl:    alendronate (FOSAMAX) 70 mg tablet, Take 1 tablet (70 mg total) by mouth once a week., Disp: 12 tablet, Rfl: 3   allopurinoL (ZYLOPRIM) 100 mg tablet, Take 1 tablet (100 mg total) by mouth daily., Disp: 90 tablet, Rfl: 3   aspirin  81 mg chewable tablet, Chew 81 mg daily., Disp: , Rfl:    atorvastatin  (LIPITOR) 40 mg tablet, Take 1 tablet (40 mg total) by mouth at bedtime., Disp: 90 tablet, Rfl: 3   carvediloL  (COREG ) 12.5 mg tablet, Take 1 tablet (12.5 mg total) by mouth 2 (two) times a day., Disp: 180 tablet, Rfl: 3   cholecalciferol (VITAMIN D3) 1,000 unit (25 mcg) tablet, Take 1,000 Units by mouth daily., Disp: , Rfl:    cinacalcet (SENSIPAR) 30 mg tablet, TAKE 1 TABLET BY MOUTH TWICE DAILY WITH FOOD OR  SHORTLY  AFTER  A  MEAL, SWALLOW TABLET WHOLE, DO NOT BREAK OR DIVIDE, Disp: 60 tablet, Rfl: 11   clopidogreL (PLAVIX) 75 mg tablet, Take 1 tablet (75 mg total) by mouth daily., Disp: 90 tablet, Rfl: 3   donepeziL  (ARICEPT ) 5 mg tablet, Take 1 tablet (5 mg total) by mouth nightly for 30 days, THEN 2 tablets (10 mg total) nightly., Disp: 30 tablet, Rfl: 0   ferrous sulfate 325 mg (65 mg iron) tablet, Take 1 tablet (325 mg total) by mouth daily before breakfast., Disp: ,  Rfl:    fluticasone propionate (FLONASE) 50 mcg/spray nasal spray, Administer 2 sprays into each nostril daily., Disp: 18.2 mL, Rfl: 5   multivitamin, adult, (Centravites) 0.4-162-18 mg tab, Take 1 tablet by mouth daily., Disp: , Rfl:    pantoprazole  (PROTONIX ) 40 mg EC tablet, Take 1 tablet (40 mg total) by mouth every morning before breakfast., Disp: 90 tablet, Rfl: 3   psyllium (Metamucil Fiber Singles) 3.4 gram packet, Take 1 packet by mouth daily., Disp: , Rfl:      Medication Request/Refills: Pharmacy Information (if applicable)   [x] Not Applicable       []  Pharmacy listed  Send Medication Request to:                                                 [] Pharmacy not listed (added to pharmacy list in Epic) Send Medication Request to:      Listed Pharmacies: [id number] - [address] [place], [place] - [address] - PHONE: [phone] - FAX: [phone]

## 2024-03-20 NOTE — Telephone Encounter (Signed)
 Pt's husband Zachary is requesting a call back from you to discuss something with you privately.  He is requesting home health for pt for assistance with medication management, assistance with bathing/dressing and wants to know if she needs to take a probiotic every day?  Zachary [phone]

## 2024-03-23 NOTE — Telephone Encounter (Signed)
 Forms of request is being mailed out.

## 2024-03-23 NOTE — Telephone Encounter (Signed)
 He wants power of attorney.  You can mail him a copy of one of our medical power of attorney forms (I told them he will need to search [place]  power of attorney online to get a general form but he wants us  to mail the healthcare to him)

## 2024-03-24 NOTE — Telephone Encounter (Signed)
 I just wanted to update you that I reached out to this patient about her n/s for the pulm consult this morning. Mailed out n/s letter to address on file.

## 2024-04-04 ENCOUNTER — Other Ambulatory Visit: Payer: Self-pay

## 2024-04-04 ENCOUNTER — Encounter (HOSPITAL_COMMUNITY): Payer: Self-pay

## 2024-04-04 ENCOUNTER — Emergency Department (HOSPITAL_COMMUNITY)
Admission: EM | Admit: 2024-04-04 | Discharge: 2024-04-04 | Disposition: A | Discharge status: Home / Self Care | Source: Home / Self Care | Attending: Emergency Medicine | Admitting: Emergency Medicine

## 2024-04-04 ENCOUNTER — Emergency Department (HOSPITAL_COMMUNITY)

## 2024-04-04 DIAGNOSIS — R519 Headache, unspecified: Secondary | ICD-10-CM | POA: Insufficient documentation

## 2024-04-04 DIAGNOSIS — E876 Hypokalemia: Secondary | ICD-10-CM | POA: Insufficient documentation

## 2024-04-04 DIAGNOSIS — Z7982 Long term (current) use of aspirin: Secondary | ICD-10-CM | POA: Insufficient documentation

## 2024-04-04 LAB — CBC WITH DIFFERENTIAL/PLATELET
Abs Immature Granulocytes: 0.01 K/uL (ref 0.00–0.07)
Basophils Absolute: 0 K/uL (ref 0.0–0.1)
Basophils Relative: 1 %
Eosinophils Absolute: 0.1 K/uL (ref 0.0–0.5)
Eosinophils Relative: 1 %
HCT: 31.2 % — ABNORMAL LOW (ref 36.0–46.0)
Hemoglobin: 10.1 g/dL — ABNORMAL LOW (ref 12.0–15.0)
Immature Granulocytes: 0 %
Lymphocytes Relative: 25 %
Lymphs Abs: 1.4 K/uL (ref 0.7–4.0)
MCH: 22.6 pg — ABNORMAL LOW (ref 26.0–34.0)
MCHC: 32.4 g/dL (ref 30.0–36.0)
MCV: 69.8 fL — ABNORMAL LOW (ref 80.0–100.0)
Monocytes Absolute: 0.4 K/uL (ref 0.1–1.0)
Monocytes Relative: 8 %
Neutro Abs: 3.7 K/uL (ref 1.7–7.7)
Neutrophils Relative %: 65 %
Platelets: 202 K/uL (ref 150–400)
RBC: 4.47 MIL/uL (ref 3.87–5.11)
RDW: 17 % — ABNORMAL HIGH (ref 11.5–15.5)
Smear Review: NORMAL
WBC: 5.6 K/uL (ref 4.0–10.5)
nRBC: 0 % (ref 0.0–0.2)

## 2024-04-04 LAB — COMPREHENSIVE METABOLIC PANEL WITH GFR
ALT: 34 U/L (ref 0–44)
AST: 45 U/L — ABNORMAL HIGH (ref 15–41)
Albumin: 3.8 g/dL (ref 3.5–5.0)
Alkaline Phosphatase: 74 U/L (ref 38–126)
Anion gap: 11 (ref 5–15)
BUN: 10 mg/dL (ref 8–23)
CO2: 28 mmol/L (ref 22–32)
Calcium: 8.3 mg/dL — ABNORMAL LOW (ref 8.9–10.3)
Chloride: 105 mmol/L (ref 98–111)
Creatinine, Ser: 0.83 mg/dL (ref 0.44–1.00)
GFR, Estimated: >60 mL/min
Glucose, Bld: 90 mg/dL (ref 70–99)
Potassium: 3.2 mmol/L — ABNORMAL LOW (ref 3.5–5.1)
Sodium: 144 mmol/L (ref 135–145)
Total Bilirubin: 0.5 mg/dL (ref 0.0–1.2)
Total Protein: 7.7 g/dL (ref 6.5–8.1)

## 2024-04-04 LAB — RESP PANEL BY RT-PCR (RSV, FLU A&B, COVID)  RVPGX2
Influenza A by PCR: NEGATIVE
Influenza B by PCR: NEGATIVE
Resp Syncytial Virus by PCR: NEGATIVE
SARS Coronavirus 2 by RT PCR: NEGATIVE

## 2024-04-04 LAB — I-STAT CHEM 8, ED
BUN: 10 mg/dL (ref 8–23)
Calcium, Ion: 0.85 mmol/L — CL (ref 1.15–1.40)
Chloride: 107 mmol/L (ref 98–111)
Creatinine, Ser: 0.8 mg/dL (ref 0.44–1.00)
Glucose, Bld: 87 mg/dL (ref 70–99)
HCT: 33 % — ABNORMAL LOW (ref 36.0–46.0)
Hemoglobin: 11.2 g/dL — ABNORMAL LOW (ref 12.0–15.0)
Potassium: 3.6 mmol/L (ref 3.5–5.1)
Sodium: 143 mmol/L (ref 135–145)
TCO2: 28 mmol/L (ref 22–32)

## 2024-04-04 LAB — URINALYSIS, ROUTINE W REFLEX MICROSCOPIC
Bilirubin Urine: NEGATIVE
Glucose, UA: NEGATIVE mg/dL
Ketones, ur: NEGATIVE mg/dL
Leukocytes,Ua: NEGATIVE
Nitrite: NEGATIVE
Protein, ur: NEGATIVE mg/dL
Specific Gravity, Urine: 1.015 (ref 1.005–1.030)
pH: 8 (ref 5.0–8.0)

## 2024-04-04 LAB — URINALYSIS, MICROSCOPIC (REFLEX): Bacteria, UA: NONE SEEN

## 2024-04-04 LAB — I-STAT CG4 LACTIC ACID, ED: Lactic Acid, Venous: 1.4 mmol/L (ref 0.5–1.9)

## 2024-04-04 MED ORDER — IOHEXOL 350 MG/ML SOLN
75.0000 mL | Freq: Once | INTRAVENOUS | Status: AC | PRN
  Administered 2024-04-04: 75 mL via INTRAVENOUS

## 2024-04-04 MED ORDER — ACETAMINOPHEN 500 MG PO TABS
1000.0000 mg | ORAL_TABLET | Freq: Once | ORAL | Status: AC
  Administered 2024-04-04: 1000 mg via ORAL
  Filled 2024-04-04: qty 2

## 2024-04-04 MED ORDER — HYDRALAZINE HCL 20 MG/ML IJ SOLN
10.0000 mg | Freq: Once | INTRAMUSCULAR | Status: AC
  Administered 2024-04-04: 10 mg via INTRAVENOUS
  Filled 2024-04-04: qty 1

## 2024-04-04 MED ORDER — POTASSIUM CHLORIDE 20 MEQ PO PACK
40.0000 meq | PACK | Freq: Once | ORAL | Status: AC
  Administered 2024-04-04: 40 meq via ORAL
  Filled 2024-04-04: qty 2

## 2024-04-04 MED ORDER — LABETALOL HCL 5 MG/ML IV SOLN
10.0000 mg | Freq: Once | INTRAVENOUS | Status: DC
  Filled 2024-04-04: qty 4

## 2024-04-04 NOTE — ED Notes (Signed)
 RN notified of critical Istat ical value. KIT

## 2024-04-04 NOTE — ED Notes (Signed)
 Patient transported to CT

## 2024-04-04 NOTE — ED Provider Notes (Signed)
 " [place] EMERGENCY DEPARTMENT AT [facility] Provider Note   CSN: [id number] Arrival date & time: 04/04/24  1208     Patient presents with: Headache and Back Pain   Allison Schmidt is a 78 y.o. female.   78 year old female with prior medical history as detailed below presents for evaluation.  Patient complains of diffuse pain, myalgia, headache.  Symptoms began this morning.  Patient complains of incontinence of urine and increased dysuria.  No fevers reported.  No cough or congestion reported.  Mild diffuse headache.  Patient with history of recently increased headaches after diagnosis of stroke several weeks ago.  Patient did not take her blood pressure medicines or other medications this morning.  The history is provided by the patient and medical records.       Prior to Admission medications  Medication Sig Start Date End Date Taking? Authorizing Provider  albuterol (VENTOLIN HFA) 108 (90 Base) MCG/ACT inhaler Inhale into the lungs. 01/12/21 01/12/22  [provider]  ALPRAZolam (XANAX) 0.5 MG tablet Take one tablet on way to exam for anxiety. May repeat X 1 if still having anxiety. 08/29/20   [provider]  aspirin  81 MG chewable tablet Chew 81 mg by mouth daily.    [provider]  azelastine (ASTELIN) 0.1 % nasal spray Place into the nose. 10/05/19   [provider]  cetirizine-pseudoephedrine (ZYRTEC-D) 5-120 MG tablet Take by mouth. 10/05/19   [provider]  cinacalcet (SENSIPAR) 30 MG tablet Take by mouth.    [provider]  docusate sodium  (COLACE) 100 MG capsule Take 1 capsule (100 mg total) by mouth every 12 (twelve) hours. 03/10/21   Lenor Hollering, MD  ferrous sulfate 325 (65 FE) MG tablet Take 1 tablet by mouth daily with breakfast. 08/25/20   [provider]  fluticasone (FLONASE) 50 MCG/ACT nasal spray Place into the nose. 04/29/20   [provider]  gabapentin (NEURONTIN) 400 MG capsule  Take 1 capsule by mouth 3 (three) times daily. 08/07/16   [provider]  HYDROcodone -acetaminophen  (NORCO/VICODIN) 5-325 MG tablet Take 1 tablet by mouth every 6 (six) hours as needed. 03/10/21   Lenor Hollering, MD  LORazepam  (ATIVAN ) 1 MG tablet 1 or 2 for MRI 11/09/20   [provider]  losartan (COZAAR) 50 MG tablet Take 1 tablet by mouth daily. 10/03/20   [provider]  methocarbamol  (ROBAXIN ) 500 MG tablet Take 1 tablet (500 mg total) by mouth 2 (two) times daily. 03/13/21   Melvenia Motto, MD  methylPREDNISolone  (MEDROL  DOSEPAK) 4 MG TBPK tablet Take with breakfast 08/11/22   Cottie Donnice PARAS, MD  montelukast (SINGULAIR) 10 MG tablet Take 1 tablet by mouth at bedtime. 10/01/16   [provider]  Multiple Vitamins-Minerals (CENTRAVITES) TABS Take 1 tablet by mouth daily.    [provider]  mupirocin  ointment (BACTROBAN ) 2 % APPLY TO AFFECTED AREA TWICE DAILY TO NASAL PASSAGE FOR CRUSTING AS NEEDED 12/30/20   [provider]  omeprazole (PRILOSEC) 40 MG capsule TAKE 1 CAPSULE BY MOUTH ONCE DAILY 30 MINUTES BEFORE BREAKFAST 09/17/16   [provider]  potassium chloride  SA (KLOR-CON ) 20 MEQ tablet Take 1 tablet by mouth daily. 11/12/16   [provider]  psyllium (KONSYL) 33 % POWD Take by mouth.    [provider]  simvastatin (ZOCOR) 40 MG tablet TAKE 1 TABLET BY MOUTH ONCE DAILY NIGHTLY 12/13/16   [provider]  traMADol  (ULTRAM ) 50 MG tablet Take 1 tablet (  50 mg total) by mouth every 6 (six) hours as needed. 11/05/22   Patt Alm Macho, MD    Allergies: Codeine    Review of Systems  All other systems reviewed and are negative.   Updated Vital Signs BP (!) 218/92   Pulse 69   Temp 98.3 F (36.8 C) (Oral)   Resp 16   SpO2 100%   Physical Exam Vitals and nursing note reviewed.  Constitutional:      General: She is not in acute distress.    Appearance: She is well-developed.  HENT:     Head:  Normocephalic and atraumatic.  Eyes:     Conjunctiva/sclera: Conjunctivae normal.  Cardiovascular:     Rate and Rhythm: Normal rate and regular rhythm.     Heart sounds: No murmur heard. Pulmonary:     Effort: Pulmonary effort is normal. No respiratory distress.     Breath sounds: Normal breath sounds.  Abdominal:     Palpations: Abdomen is soft.     Tenderness: There is no abdominal tenderness.  Musculoskeletal:        General: No swelling.     Cervical back: Neck supple.  Skin:    General: Skin is warm and dry.     Capillary Refill: Capillary refill takes less than 2 seconds.  Neurological:     Mental Status: She is alert and oriented to person, place, and time. Mental status is at baseline.     GCS: GCS eye subscore is 4. GCS verbal subscore is 5. GCS motor subscore is 6.     Cranial Nerves: No cranial nerve deficit.  Psychiatric:        Mood and Affect: Mood normal.     (all labs ordered are listed, but only abnormal results are displayed) Labs Reviewed  RESP PANEL BY RT-PCR (RSV, FLU A&B, COVID)  RVPGX2  URINALYSIS, ROUTINE W REFLEX MICROSCOPIC  CBC WITH DIFFERENTIAL/PLATELET  COMPREHENSIVE METABOLIC PANEL WITH GFR  I-STAT CHEM 8, ED  I-STAT CG4 LACTIC ACID, ED    EKG: None  Radiology: No results found.   Procedures   Medications Ordered in the ED  acetaminophen  (TYLENOL ) tablet 1,000 mg (has no administration in time range)  labetalol  (NORMODYNE ) injection 10 mg (has no administration in time range)                                    Medical Decision Making Patient presents with reported headache.  She did not take her morning blood pressure medications.  She is neurologically at baseline.  Blood pressure was elevated in triage.  She was given a dose of hydralazine  and a dose of Tylenol  for treatment of headache and pain.  Obtained imaging and labs are reassuringly without significant acute abnormality.  Specifically there is no evidence of intracranial  hemorrhage, new CVA, ruptured aneurysm.  Labs on the whole are reassuringly without acute abnormality other than mild hypokalemia.  Potassium was repleted here in the ED.  Reevaluation the patient feels much improved.  She again is at baseline with regard to her neurologic function.  She and family members present at bedside desire discharged.  Importance of close follow-up is stressed.  Strict return precautions given and understood.  Patient's blood pressure is elevated at time of discharge.  Patient and family would prefer to have her take her home medications at home.  They declined a dose of p.o. antihypertensive in the  ED.  Amount and/or Complexity of Data Reviewed Labs: ordered. Radiology: ordered.  Risk OTC drugs. Prescription drug management.        Final diagnoses:  Nonintractable headache, unspecified chronicity pattern, unspecified headache type    ED Discharge Orders     None          Laurice Maude BROCKS, MD 04/04/24 1521  "

## 2024-04-04 NOTE — ED Triage Notes (Signed)
 Patient with hx of dementia from home for eval of back pain and headache. Family called out for stroke symptoms. Has L facial droop and facial twitching from previous surgery and mobility issues at baseline. Patient complains of back pain and headache today. Incontinent of urine and states it hurt when she urinated.

## 2024-04-04 NOTE — Discharge Instructions (Signed)
 Return for any problem.  ?

## 2024-04-05 ENCOUNTER — Inpatient Hospital Stay (HOSPITAL_COMMUNITY)
Admission: EM | Admit: 2024-04-05 | Discharge: 2024-04-07 | DRG: 064 | Disposition: A | Discharge status: Home Health | Attending: Neurology | Admitting: Neurology

## 2024-04-05 ENCOUNTER — Other Ambulatory Visit: Payer: Self-pay

## 2024-04-05 ENCOUNTER — Encounter (HOSPITAL_COMMUNITY): Payer: Self-pay | Admitting: *Deleted

## 2024-04-05 ENCOUNTER — Inpatient Hospital Stay (HOSPITAL_COMMUNITY)

## 2024-04-05 ENCOUNTER — Emergency Department (HOSPITAL_COMMUNITY)

## 2024-04-05 DIAGNOSIS — R1310 Dysphagia, unspecified: Secondary | ICD-10-CM | POA: Diagnosis present

## 2024-04-05 DIAGNOSIS — I69391 Dysphagia following cerebral infarction: Secondary | ICD-10-CM | POA: Diagnosis not present

## 2024-04-05 DIAGNOSIS — I739 Peripheral vascular disease, unspecified: Secondary | ICD-10-CM | POA: Diagnosis present

## 2024-04-05 DIAGNOSIS — K219 Gastro-esophageal reflux disease without esophagitis: Secondary | ICD-10-CM | POA: Diagnosis present

## 2024-04-05 DIAGNOSIS — N182 Chronic kidney disease, stage 2 (mild): Secondary | ICD-10-CM | POA: Diagnosis present

## 2024-04-05 DIAGNOSIS — F03918 Unspecified dementia, unspecified severity, with other behavioral disturbance: Secondary | ICD-10-CM | POA: Diagnosis present

## 2024-04-05 DIAGNOSIS — I619 Nontraumatic intracerebral hemorrhage, unspecified: Secondary | ICD-10-CM | POA: Diagnosis present

## 2024-04-05 DIAGNOSIS — F32A Depression, unspecified: Secondary | ICD-10-CM | POA: Diagnosis present

## 2024-04-05 DIAGNOSIS — E859 Amyloidosis, unspecified: Secondary | ICD-10-CM | POA: Diagnosis not present

## 2024-04-05 DIAGNOSIS — F0393 Unspecified dementia, unspecified severity, with mood disturbance: Secondary | ICD-10-CM | POA: Diagnosis present

## 2024-04-05 DIAGNOSIS — E854 Organ-limited amyloidosis: Secondary | ICD-10-CM | POA: Diagnosis present

## 2024-04-05 DIAGNOSIS — R29713 NIHSS score 13: Secondary | ICD-10-CM | POA: Diagnosis present

## 2024-04-05 DIAGNOSIS — Z87891 Personal history of nicotine dependence: Secondary | ICD-10-CM | POA: Diagnosis not present

## 2024-04-05 DIAGNOSIS — Z7982 Long term (current) use of aspirin: Secondary | ICD-10-CM

## 2024-04-05 DIAGNOSIS — I611 Nontraumatic intracerebral hemorrhage in hemisphere, cortical: Secondary | ICD-10-CM | POA: Diagnosis not present

## 2024-04-05 DIAGNOSIS — I10 Essential (primary) hypertension: Secondary | ICD-10-CM | POA: Diagnosis not present

## 2024-04-05 DIAGNOSIS — E876 Hypokalemia: Secondary | ICD-10-CM | POA: Diagnosis present

## 2024-04-05 DIAGNOSIS — I610 Nontraumatic intracerebral hemorrhage in hemisphere, subcortical: Secondary | ICD-10-CM | POA: Diagnosis present

## 2024-04-05 DIAGNOSIS — F40240 Claustrophobia: Secondary | ICD-10-CM | POA: Diagnosis present

## 2024-04-05 DIAGNOSIS — D569 Thalassemia, unspecified: Secondary | ICD-10-CM | POA: Diagnosis present

## 2024-04-05 DIAGNOSIS — I629 Nontraumatic intracranial hemorrhage, unspecified: Principal | ICD-10-CM

## 2024-04-05 DIAGNOSIS — G4700 Insomnia, unspecified: Secondary | ICD-10-CM | POA: Diagnosis present

## 2024-04-05 DIAGNOSIS — E7800 Pure hypercholesterolemia, unspecified: Secondary | ICD-10-CM | POA: Diagnosis present

## 2024-04-05 DIAGNOSIS — D509 Iron deficiency anemia, unspecified: Secondary | ICD-10-CM | POA: Diagnosis present

## 2024-04-05 DIAGNOSIS — R569 Unspecified convulsions: Secondary | ICD-10-CM | POA: Diagnosis not present

## 2024-04-05 DIAGNOSIS — I680 Cerebral amyloid angiopathy: Secondary | ICD-10-CM | POA: Diagnosis present

## 2024-04-05 DIAGNOSIS — I639 Cerebral infarction, unspecified: Secondary | ICD-10-CM | POA: Diagnosis not present

## 2024-04-05 DIAGNOSIS — I129 Hypertensive chronic kidney disease with stage 1 through stage 4 chronic kidney disease, or unspecified chronic kidney disease: Secondary | ICD-10-CM | POA: Diagnosis present

## 2024-04-05 DIAGNOSIS — Z7902 Long term (current) use of antithrombotics/antiplatelets: Secondary | ICD-10-CM

## 2024-04-05 DIAGNOSIS — E785 Hyperlipidemia, unspecified: Secondary | ICD-10-CM | POA: Diagnosis not present

## 2024-04-05 DIAGNOSIS — I161 Hypertensive emergency: Principal | ICD-10-CM | POA: Diagnosis present

## 2024-04-05 DIAGNOSIS — R29701 NIHSS score 1: Secondary | ICD-10-CM | POA: Diagnosis not present

## 2024-04-05 DIAGNOSIS — R32 Unspecified urinary incontinence: Secondary | ICD-10-CM | POA: Diagnosis present

## 2024-04-05 DIAGNOSIS — G936 Cerebral edema: Secondary | ICD-10-CM | POA: Diagnosis present

## 2024-04-05 DIAGNOSIS — Z79899 Other long term (current) drug therapy: Secondary | ICD-10-CM | POA: Diagnosis not present

## 2024-04-05 DIAGNOSIS — I671 Cerebral aneurysm, nonruptured: Secondary | ICD-10-CM | POA: Diagnosis present

## 2024-04-05 LAB — COMPREHENSIVE METABOLIC PANEL WITH GFR
ALT: 33 U/L (ref 0–44)
AST: 44 U/L — ABNORMAL HIGH (ref 15–41)
Albumin: 4.1 g/dL (ref 3.5–5.0)
Alkaline Phosphatase: 78 U/L (ref 38–126)
Anion gap: 12 (ref 5–15)
BUN: 11 mg/dL (ref 8–23)
CO2: 26 mmol/L (ref 22–32)
Calcium: 8.9 mg/dL (ref 8.9–10.3)
Chloride: 100 mmol/L (ref 98–111)
Creatinine, Ser: 0.86 mg/dL (ref 0.44–1.00)
GFR, Estimated: >60 mL/min
Glucose, Bld: 89 mg/dL (ref 70–99)
Potassium: 3.6 mmol/L (ref 3.5–5.1)
Sodium: 139 mmol/L (ref 135–145)
Total Bilirubin: 0.7 mg/dL (ref 0.0–1.2)
Total Protein: 8.3 g/dL — ABNORMAL HIGH (ref 6.5–8.1)

## 2024-04-05 LAB — URINALYSIS, ROUTINE W REFLEX MICROSCOPIC
Bilirubin Urine: NEGATIVE
Glucose, UA: NEGATIVE mg/dL
Ketones, ur: NEGATIVE mg/dL
Leukocytes,Ua: NEGATIVE
Nitrite: NEGATIVE
Protein, ur: >300 mg/dL — AB
Specific Gravity, Urine: 1.02 (ref 1.005–1.030)
pH: 8.5 — ABNORMAL HIGH (ref 5.0–8.0)

## 2024-04-05 LAB — I-STAT CHEM 8, ED
BUN: 13 mg/dL (ref 8–23)
Calcium, Ion: 0.95 mmol/L — ABNORMAL LOW (ref 1.15–1.40)
Chloride: 102 mmol/L (ref 98–111)
Creatinine, Ser: 0.9 mg/dL (ref 0.44–1.00)
Glucose, Bld: 92 mg/dL (ref 70–99)
HCT: 36 % (ref 36.0–46.0)
Hemoglobin: 12.2 g/dL (ref 12.0–15.0)
Potassium: 5.2 mmol/L — ABNORMAL HIGH (ref 3.5–5.1)
Sodium: 140 mmol/L (ref 135–145)
TCO2: 28 mmol/L (ref 22–32)

## 2024-04-05 LAB — URINE DRUG SCREEN
Amphetamines: NEGATIVE
Barbiturates: NEGATIVE
Benzodiazepines: NEGATIVE
Cocaine: NEGATIVE
Fentanyl: NEGATIVE
Methadone Scn, Ur: NEGATIVE
Opiates: NEGATIVE
Tetrahydrocannabinol: NEGATIVE

## 2024-04-05 LAB — URINALYSIS, MICROSCOPIC (REFLEX): Bacteria, UA: NONE SEEN

## 2024-04-05 LAB — DIFFERENTIAL
Abs Immature Granulocytes: 0.02 K/uL (ref 0.00–0.07)
Basophils Absolute: 0 K/uL (ref 0.0–0.1)
Basophils Relative: 0 %
Eosinophils Absolute: 0 K/uL (ref 0.0–0.5)
Eosinophils Relative: 0 %
Immature Granulocytes: 0 %
Lymphocytes Relative: 12 %
Lymphs Abs: 1.1 K/uL (ref 0.7–4.0)
Monocytes Absolute: 0.6 K/uL (ref 0.1–1.0)
Monocytes Relative: 6 %
Neutro Abs: 7.7 K/uL (ref 1.7–7.7)
Neutrophils Relative %: 82 %

## 2024-04-05 LAB — CBC
HCT: 32.6 % — ABNORMAL LOW (ref 36.0–46.0)
Hemoglobin: 10.5 g/dL — ABNORMAL LOW (ref 12.0–15.0)
MCH: 22.5 pg — ABNORMAL LOW (ref 26.0–34.0)
MCHC: 32.2 g/dL (ref 30.0–36.0)
MCV: 70 fL — ABNORMAL LOW (ref 80.0–100.0)
Platelets: 224 K/uL (ref 150–400)
RBC: 4.66 MIL/uL (ref 3.87–5.11)
RDW: 17.5 % — ABNORMAL HIGH (ref 11.5–15.5)
WBC: 9.4 K/uL (ref 4.0–10.5)
nRBC: 0 % (ref 0.0–0.2)

## 2024-04-05 LAB — CBG MONITORING, ED: Glucose-Capillary: 94 mg/dL (ref 70–99)

## 2024-04-05 LAB — MRSA NEXT GEN BY PCR, NASAL: MRSA by PCR Next Gen: DETECTED — AB

## 2024-04-05 LAB — PROTIME-INR
INR: 1.1 (ref 0.8–1.2)
Prothrombin Time: 15 s (ref 11.4–15.2)

## 2024-04-05 LAB — ETHANOL: Alcohol, Ethyl (B): <15 mg/dL

## 2024-04-05 LAB — TROPONIN T, HIGH SENSITIVITY
Troponin T High Sensitivity: 23 ng/L — ABNORMAL HIGH (ref 0–19)
Troponin T High Sensitivity: 24 ng/L — ABNORMAL HIGH (ref 0–19)

## 2024-04-05 LAB — APTT: aPTT: 28 s (ref 24–36)

## 2024-04-05 MED ORDER — CARVEDILOL 12.5 MG PO TABS
12.5000 mg | ORAL_TABLET | Freq: Two times a day (BID) | ORAL | Status: DC
  Administered 2024-04-06 – 2024-04-07 (×3): 12.5 mg via ORAL
  Filled 2024-04-05 (×3): qty 1

## 2024-04-05 MED ORDER — ONDANSETRON HCL 4 MG/2ML IJ SOLN
4.0000 mg | Freq: Once | INTRAMUSCULAR | Status: AC
  Administered 2024-04-05: 4 mg via INTRAVENOUS
  Filled 2024-04-05: qty 2

## 2024-04-05 MED ORDER — PANTOPRAZOLE SODIUM 40 MG IV SOLR
40.0000 mg | Freq: Every day | INTRAVENOUS | Status: DC
  Administered 2024-04-05: 40 mg via INTRAVENOUS
  Filled 2024-04-05: qty 10

## 2024-04-05 MED ORDER — METOCLOPRAMIDE HCL 5 MG/ML IJ SOLN: 10.0000 mg | Freq: Once | INTRAMUSCULAR | Status: DC

## 2024-04-05 MED ORDER — CHLORHEXIDINE GLUCONATE CLOTH 2 % EX PADS
6.0000 | MEDICATED_PAD | Freq: Every day | CUTANEOUS | Status: DC
  Administered 2024-04-05 – 2024-04-06 (×2): 6 via TOPICAL

## 2024-04-05 MED ORDER — ACETAMINOPHEN 325 MG PO TABS
650.0000 mg | ORAL_TABLET | ORAL | Status: DC | PRN
  Administered 2024-04-06 – 2024-04-07 (×2): 650 mg via ORAL
  Filled 2024-04-05 (×2): qty 2

## 2024-04-05 MED ORDER — STROKE: EARLY STAGES OF RECOVERY BOOK
Freq: Once | Status: AC
  Administered 2024-04-06: 1
  Filled 2024-04-05: qty 1

## 2024-04-05 MED ORDER — ORAL CARE MOUTH RINSE
15.0000 mL | OROMUCOSAL | Status: DC
  Administered 2024-04-05 – 2024-04-07 (×6): 15 mL via OROMUCOSAL

## 2024-04-05 MED ORDER — ATORVASTATIN CALCIUM 40 MG PO TABS
40.0000 mg | ORAL_TABLET | Freq: Every day | ORAL | Status: DC
  Administered 2024-04-06 – 2024-04-07 (×2): 40 mg via ORAL
  Filled 2024-04-05 (×2): qty 1

## 2024-04-05 MED ORDER — CLEVIDIPINE BUTYRATE 0.5 MG/ML IV EMUL
0.0000 mg/h | INTRAVENOUS | Status: DC
  Administered 2024-04-05: 2 mg/h via INTRAVENOUS
  Filled 2024-04-05: qty 100

## 2024-04-05 MED ORDER — ACETAMINOPHEN 325 MG PO TABS
650.0000 mg | ORAL_TABLET | Freq: Once | ORAL | Status: AC
  Administered 2024-04-05: 650 mg via ORAL
  Filled 2024-04-05: qty 2

## 2024-04-05 MED ORDER — ORAL CARE MOUTH RINSE: 15.0000 mL | OROMUCOSAL | Status: DC | PRN

## 2024-04-05 MED ORDER — SENNOSIDES-DOCUSATE SODIUM 8.6-50 MG PO TABS
1.0000 | ORAL_TABLET | Freq: Two times a day (BID) | ORAL | Status: DC
  Administered 2024-04-06 (×2): 1 via ORAL
  Filled 2024-04-05 (×2): qty 1

## 2024-04-05 MED ORDER — ACETAMINOPHEN 650 MG RE SUPP: 650.0000 mg | RECTAL | Status: DC | PRN

## 2024-04-05 MED ORDER — DONEPEZIL HCL 10 MG PO TABS
5.0000 mg | ORAL_TABLET | Freq: Every day | ORAL | Status: DC
  Administered 2024-04-06: 5 mg via ORAL
  Filled 2024-04-05: qty 1

## 2024-04-05 MED ORDER — ACETAMINOPHEN 160 MG/5ML PO SOLN: 650.0000 mg | ORAL | Status: DC | PRN

## 2024-04-05 MED ORDER — LORAZEPAM 2 MG/ML IJ SOLN
1.0000 mg | Freq: Once | INTRAMUSCULAR | Status: AC | PRN
  Administered 2024-04-05: 1 mg via INTRAVENOUS
  Filled 2024-04-05: qty 1

## 2024-04-05 MED ORDER — MUPIROCIN 2 % EX OINT
1.0000 | TOPICAL_OINTMENT | Freq: Two times a day (BID) | CUTANEOUS | Status: DC
  Administered 2024-04-05 – 2024-04-07 (×4): 1 via NASAL
  Filled 2024-04-05 (×2): qty 22

## 2024-04-05 NOTE — ED Notes (Signed)
 Tried to stick patient for redraw and was not able to get any blood. IV does not pull back. Mini lab made aware and is attempting to get blood now.

## 2024-04-05 NOTE — ED Notes (Signed)
 Facilities contacted to increase the heat.  The family states that the patient is still very cold after receiving two warm blankets.

## 2024-04-05 NOTE — ED Provider Notes (Signed)
 " [place] EMERGENCY DEPARTMENT AT [facility] Provider Note   CSN: [id number] Arrival date & time: 04/05/24  1228     Patient presents with: Altered Mental Status   Allison Schmidt is a 78 y.o. female.   HPI 78 year old female presents with headache and altered mental status.  History is from the son.  Patient has been dealing with headaches since having a stroke about a month ago at [facility].  She also has a known history of dementia.  She was here in the ER for evaluation for hypertension and headache yesterday.  After getting home she developed confusion around 9 PM.  She is also continuing to have a headache and vomited.  Since that time around 9 PM she has been having some babbling and not making sense which is different than her normal confusion from dementia.  No fevers, cough.  She has not taken any of her medicines today. She is on plavix.  Prior to Admission medications  Medication Sig Start Date End Date Taking? Authorizing Provider  albuterol (VENTOLIN HFA) 108 (90 Base) MCG/ACT inhaler Inhale into the lungs. 01/12/21 01/12/22  [provider]  ALPRAZolam (XANAX) 0.5 MG tablet Take one tablet on way to exam for anxiety. May repeat X 1 if still having anxiety. 08/29/20   [provider]  aspirin  81 MG chewable tablet Chew 81 mg by mouth daily.    [provider]  azelastine (ASTELIN) 0.1 % nasal spray Place into the nose. 10/05/19   [provider]  cetirizine-pseudoephedrine (ZYRTEC-D) 5-120 MG tablet Take by mouth. 10/05/19   [provider]  cinacalcet (SENSIPAR) 30 MG tablet Take by mouth.    [provider]  docusate sodium  (COLACE) 100 MG capsule Take 1 capsule (100 mg total) by mouth every 12 (twelve) hours. 03/10/21   Lenor Hollering, MD  ferrous sulfate 325 (65 FE) MG tablet Take 1 tablet by mouth daily with breakfast. 08/25/20   [provider]  fluticasone (FLONASE) 50 MCG/ACT nasal spray Place  into the nose. 04/29/20   [provider]  gabapentin (NEURONTIN) 400 MG capsule Take 1 capsule by mouth 3 (three) times daily. 08/07/16   [provider]  HYDROcodone -acetaminophen  (NORCO/VICODIN) 5-325 MG tablet Take 1 tablet by mouth every 6 (six) hours as needed. 03/10/21   Lenor Hollering, MD  LORazepam  (ATIVAN ) 1 MG tablet 1 or 2 for MRI 11/09/20   [provider]  losartan (COZAAR) 50 MG tablet Take 1 tablet by mouth daily. 10/03/20   [provider]  methocarbamol  (ROBAXIN ) 500 MG tablet Take 1 tablet (500 mg total) by mouth 2 (two) times daily. 03/13/21   Melvenia Motto, MD  methylPREDNISolone  (MEDROL  DOSEPAK) 4 MG TBPK tablet Take with breakfast 08/11/22   Cottie Donnice PARAS, MD  montelukast (SINGULAIR) 10 MG tablet Take 1 tablet by mouth at bedtime. 10/01/16   [provider]  Multiple Vitamins-Minerals (CENTRAVITES) TABS Take 1 tablet by mouth daily.    [provider]  mupirocin  ointment (BACTROBAN ) 2 % APPLY TO AFFECTED AREA TWICE DAILY TO NASAL PASSAGE FOR CRUSTING AS NEEDED 12/30/20   [provider]  omeprazole (PRILOSEC) 40 MG capsule TAKE 1 CAPSULE BY MOUTH ONCE DAILY 30 MINUTES BEFORE BREAKFAST 09/17/16   [provider]  potassium chloride  SA (KLOR-CON ) 20 MEQ tablet Take 1 tablet by mouth daily. 11/12/16   [provider]  psyllium (KONSYL) 33 % POWD Take by mouth.    [provider]  simvastatin (ZOCOR) 40 MG tablet TAKE 1 TABLET BY MOUTH ONCE DAILY NIGHTLY 12/13/16   [provider]  traMADol  (ULTRAM ) 50 MG tablet Take 1 tablet (50 mg total) by mouth every 6 (six) hours as needed. 11/05/22   Patt Alm Macho, MD    Allergies: Codeine    Review of Systems  Unable to perform ROS: Mental status change    Updated Vital Signs BP (!) 193/94 (BP Location: Right Arm)   Pulse 72   Temp 98.1 F (36.7 C) (Oral)   Resp (!) 22   Ht 5' 5 (1.651 m)   Wt 44 kg   SpO2 100%   BMI 16.14 kg/m    Physical Exam Vitals and nursing note reviewed.  Constitutional:      Appearance: She is well-developed and underweight.  HENT:     Head: Normocephalic and atraumatic.  Cardiovascular:     Rate and Rhythm: Normal rate and regular rhythm.     Heart sounds: Normal heart sounds.  Pulmonary:     Effort: Pulmonary effort is normal.     Breath sounds: Normal breath sounds.  Abdominal:     Palpations: Abdomen is soft.     Tenderness: There is no abdominal tenderness.  Skin:    General: Skin is warm and dry.  Neurological:     Mental Status: She is alert.     Comments: Awake, alert, disoriented/confused. She has left eye/face twitching that is normal for her per family.  No obvious left arm drift but has weaker grip strength in the left hand compared to the right.  Both legs move symmetrically.     (all labs ordered are listed, but only abnormal results are displayed) Labs Reviewed  MRSA NEXT GEN BY PCR, NASAL - Abnormal; Notable for the following components:      Result Value   MRSA by PCR Next Gen DETECTED (*)    All other components within normal limits  CBC - Abnormal; Notable for the following components:   Hemoglobin 10.5 (*)    HCT 32.6 (*)    MCV 70.0 (*)    MCH 22.5 (*)    RDW 17.5 (*)    All other components within normal limits  URINALYSIS, ROUTINE W REFLEX MICROSCOPIC - Abnormal; Notable for the following components:   pH 8.5 (*)    Hgb urine dipstick TRACE (*)    Protein, ur >300 (*)    All other components within normal limits  COMPREHENSIVE METABOLIC PANEL WITH GFR - Abnormal; Notable for the following components:   Total Protein 8.3 (*)    AST 44 (*)    All other components within normal limits  I-STAT CHEM 8, ED - Abnormal; Notable for the following components:   Potassium 5.2 (*)    Calcium , Ion 0.95 (*)    All other components within normal limits  TROPONIN T, HIGH SENSITIVITY - Abnormal; Notable for the following components:   Troponin T High  Sensitivity 24 (*)    All other components within normal limits  TROPONIN T, HIGH SENSITIVITY - Abnormal; Notable for the following components:   Troponin T High Sensitivity 23 (*)    All other components within normal limits  DIFFERENTIAL  ETHANOL  URINE DRUG SCREEN  URINALYSIS, MICROSCOPIC (REFLEX)  PROTIME-INR  APTT  CBC  COMPREHENSIVE METABOLIC PANEL WITH GFR  CBG MONITORING, ED    EKG: None  Radiology: CT ANGIO HEAD NECK W WO CM Result Date: 04/04/2024 EXAM: CTA Head and Neck with  Intravenous Contrast. CT Head without Contrast. CLINICAL HISTORY: headache, hx of aneurysm TECHNIQUE: Axial CTA images of the head and neck performed with intravenous contrast. MIP reconstructed images were created and reviewed. Axial computed tomography images of the head/brain performed without intravenous contrast. Note: Per PQRS, the description of internal carotid artery percent stenosis, including 0 percent or normal exam, is based on [employer] Symptomatic Carotid Endarterectomy Trial (NASCET) criteria. Dose reduction technique was used including one or more of the following: automated exposure control, adjustment of mA and kV according to patient size, and/or iterative reconstruction. 75 mL of iohexol  (OMNIPAQUE ) 350 MG/ML injection was administered intravenously. CONTRAST: 75 ml omnipaque  350 COMPARISON: CT head without contrast 01/06/19. CT angio head 01/06/19. MR head without and with contrast 08/29/20. FINDINGS: CT HEAD: BRAIN: Moderate periventricular and scattered subcortical white matter hypoattenuation demonstrates some progression bilaterally. No acute intraparenchymal hemorrhage. No mass lesion. No CT evidence for acute territorial infarct. No midline shift or extra-axial collection. VENTRICLES: No hydrocephalus. ORBITS: The orbits are unremarkable. SINUSES AND MASTOIDS: The paranasal sinuses and mastoid air cells are clear. CTA NECK: COMMON CAROTID ARTERIES: Mural calcifications are present  in the distal right common carotid artery and at the right carotid bifurcation without significant stenosis relative to the more distal vessel. Diffuse mural calcification are present in the left common carotid artery. Atherosclerotic changes are present at the left carotid bifurcation without significant stenosis. No dissection or occlusion. INTERNAL CAROTID ARTERIES: Atherosclerotic calcifications are present within the cavernous internal carotid arteries bilaterally without focal stenosis. No stenosis by NASCET criteria. No dissection or occlusion. VERTEBRAL ARTERIES: The vertebral arteries are codominant. Dense calcification is present in the proximal left vertebral artery with at least 50% stenosis. No dissection or occlusion. 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 No significant stenosis. No occlusion. MIDDLE CEREBRAL ARTERIES: A 2 mm proximal right M2 segment aneurysm is stable on image 80 of series 13. No significant stenosis. No occlusion. POSTERIOR CEREBRAL ARTERIES: No significant stenosis. No occlusion. No aneurysm. BASILAR ARTERY: No significant stenosis. No occlusion. No aneurysm. OTHER: Dense atherosclerotic calcifications are present at the aortic arch and great vessel origins without focal stenosis or aneurysm. No aortic dissection, intramural hematoma, aortic ulceration, or rupture is identified. SOFT TISSUES: Residual right thyroid. No acute finding. No masses or lymphadenopathy. BONES: No acute osseous abnormality. IMPRESSION: 1. 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 2. Dense calcification in the proximal left vertebral artery with at least 50% stenosis. 3. Moderate periventricular and scattered subcortical white matter hypoattenuation demonstrates some progression bilaterally. Electronically signed by: Lonni Necessary MD 04/04/2024 02:57 PM  EST RP Workstation: [id number]   DG Chest Port 1 View Result Date: 04/04/2024 CLINICAL DATA:  Weakness. EXAM: PORTABLE CHEST 1 VIEW COMPARISON:  Chest radiograph dated 08/10/2019. FINDINGS: No focal consolidation, pleural effusion or pneumothorax. Mild cardiomegaly. Atherosclerotic calcification of the aorta. No acute osseous pathology. IMPRESSION: 1. No active disease. 2. Mild cardiomegaly. Electronically Signed   By: Vanetta Chou M.D.   On: 04/04/2024 14:26     .Critical Care  Performed by: Freddi Hamilton, MD Authorized by: Freddi Hamilton, MD   Critical care provider statement:    Critical care time (minutes):  35   Critical care time was exclusive of:  Separately billable procedures and treating other patients   Critical care was necessary to treat or prevent imminent or life-threatening deterioration of the following  conditions:  CNS failure or compromise   Critical care was time spent personally by me on the following activities:  Development of treatment plan with patient or surrogate, discussions with consultants, evaluation of patient's response to treatment, examination of patient, ordering and review of laboratory studies, ordering and review of radiographic studies, ordering and performing treatments and interventions, pulse oximetry, re-evaluation of patient's condition and review of old charts    Medications Ordered in the ED  ondansetron  (ZOFRAN ) injection 4 mg (has no administration in time range)  acetaminophen  (TYLENOL ) tablet 650 mg (has no administration in time range)                                    Medical Decision Making Amount and/or Complexity of Data Reviewed Independent Historian:     Details: Son Labs: ordered.    Details: Normal creatinine Radiology: ordered.    Details: Mild intracranial hemorrhage ECG/medicine tests: ordered and independent interpretation performed.    Details: Nonspecific changes  Risk OTC drugs. Prescription drug  management. Decision regarding hospitalization.   Patient presents with new change in mental status and vomiting in association with significant hypertension.  Has a small head bleed.  Started on Cleviprex  given out-of-control blood pressure.  She is maintaining her own airway.  Discussed with Dr. Michaela, who will admit to the neuro ICU.  She is on Plavix.  Otherwise he is requesting MRI as well.  Updated family.     Final diagnoses:  Intracranial hemorrhage [facility])  Hypertensive emergency    ED Discharge Orders     None          Freddi Hamilton, MD 04/05/24 2027  "

## 2024-04-05 NOTE — ED Triage Notes (Signed)
 Patient presents to ed via [facility] from home was seen in the ED yest and discharged with c/o headache per family patient had decreased in mental status last pm at 8 pm , this am per family patient seem more confused with c/o headache and bilateral earache.Patient is alert disoriented however has a history of dementia

## 2024-04-05 NOTE — H&P (Addendum)
 NEUROLOGY H&P NOTE   Date of service: April 05, 2024 Patient Name: Allison Schmidt MRN:  [id number] DOB:  1945-09-25 Chief Complaint: altered mental status  History of Present Illness  Allison Schmidt is a 78 y.o. female with hx of R frontal infarct a month ago (on DAPT currently), known aneurysms, HTN, HLD, Dementia (Aricept  started in October), PAD (on chronic DAPT for this) who was BIB family to ED d/t severe headache and continued altered mental status.   Patient also presented to [facility] 12/20 d/t headaches and hypertension. She was assessed in ED, CTH negative, chart notes state that she was hypertensive but family refused blood pressure medications here and preferred to give her home medicines after discharge. CTH negative for acute abnormality, CTA showed known aneurysms.  Today, family notes that once she was home from ED last night, her headaches became worse, became more confused and vomited x1. They brought her in today, as she continued to be confused and complain of severe headache.   CTH completed today shows development of 7mm hemorrhage right frontal lobe with mild surrounding edema. Neurology consulted for ICH evaluation and admission.   On exam, family ay bedside. Patient is drowsy, confused, c/o severe right-sided headache, generalized weakness with movement in all extremities with slightly weaker on left side, no noted sensory deficits, uncooperative with most testing.   Patient had small acute cortical right frontal infarct October 2025, admitted to [facility]. Echo done at this time showed EF 40-45%. CT Angio showed known aneurysm ACA, Right M2. Discharged on Aspirin  and Plavix.   Last known well: 12/20 Modified rankin score: 2-Slight disability-UNABLE to perform all activities but does not need assistance ICH Score: 1 tNKASE: Not offered due to ICH Thrombectomy: not offered due to ICH NIHSS components Score: Comment  1a Level of Conscious 0[]  1[x]  2[]   3[]      1b LOC Questions 0[]  1[]  2[x]       1c LOC Commands 0[]  1[x]  2[]       2 Best Gaze 0[x]  1[]  2[]       3 Visual 0[x]  1[]  2[]  3[]      4 Facial Palsy 0[]  1[x]  2[]  3[]      5a Motor Arm - left 0[]  1[x]  2[]  3[]  4[]  UN[]    5b Motor Arm - Right 0[]  1[x]  2[]  3[]  4[]  UN[]    6a Motor Leg - Left 0[]  1[]  2[x]  3[]  4[]  UN[]    6b Motor Leg - Right 0[]  1[]  2[x]  3[]  4[]  UN[]    7 Limb Ataxia 0[]  1[]  2[]  UN[x]      8 Sensory 0[x]  1[]  2[]  UN[]      9 Best Language 0[]  1[]  2[x]  3[]      10 Dysarthria 0[x]  1[]  2[]  UN[]      11 Extinct. and Inattention 0[x]  1[]  2[]       TOTAL:   13      ROS   Unable to perform due to patient's AMS  Past History   Past Medical History:  Diagnosis Date   Blood transfusion without reported diagnosis    High cholesterol    Hypertension    Thalassemia    Past Surgical History:  Procedure Laterality Date   BACK SURGERY     CATARACT EXTRACTION     History reviewed. No pertinent family history. Social History   Socioeconomic History   Marital status: Married    Spouse name: Not on file   Number of children: Not on file   Years of education: Not on file  Highest education level: Not on file  Occupational History   Not on file  Tobacco Use   Smoking status: Former    Types: Cigarettes   Smokeless tobacco: Never  Substance and Sexual Activity   Alcohol use: Not Currently   Drug use: Never   Sexual activity: Not on file  Other Topics Concern   Not on file  Social History Narrative   Not on file   Social Drivers of Health   Tobacco Use: Medium Risk (04/05/2024)   Patient History    Smoking Tobacco Use: Former    Smokeless Tobacco Use: Never    Passive Exposure: Not on file  Financial Resource Strain: Low Risk  (05/05/2021)   Received from [facility] visits prior to 06/16/2022.   Overall Financial Resource Strain (CARDIA)    Difficulty of Paying Living Expenses: Not hard at all  Food Insecurity: Low Risk (02/02/2024)   [facility]   Epic    Within the past 12 months, you worried that your food would run out before you got money to buy more: Never true    Within the past 12 months, the food you bought just didn't last and you didn't have money to get more. : Never true  Transportation Needs: No Transportation Needs (02/02/2024)   Received from [employer]    In the past 12 months, has lack of reliable transportation kept you from medical appointments, meetings, work or from getting things needed for daily living? : No  Physical Activity: Inactive (05/05/2021)   Received from [facility] visits prior to 06/16/2022.   Exercise Vital Sign    On average, how many days per week do you engage in moderate to strenuous exercise (like a brisk walk)?: 0 days    On average, how many minutes do you engage in exercise at this level?: 0 min  Stress: No Stress Concern Present (05/05/2021)   [facility] visits prior to 06/16/2022.   [employer] of Occupational Health - Occupational Stress Questionnaire    Feeling of Stress : Not at all  Social Connections: Unknown (05/05/2021)   [facility] visits prior to 06/16/2022.   Social Connection and Isolation Panel    Frequency of Communication with Friends and Family: Not on file    How often do you get together with friends or relatives?: Once a week    How often do you attend church or religious services?: More than 4 times per year    Active Member of Clubs or Organizations: Not on file    How often do you attend meetings of the clubs or organizations you belong to?: Never    Are you married, widowed, divorced, separated, never married, or living with a partner?: Married  Depression (PHQ2-9): Not on file  Alcohol Screen: Not on file  Housing: Low Risk (02/02/2024)   [facility]   Epic    What is your living situation today?: I have a steady  place to live    Think about the place you live. Do you have problems with any of the following? Choose all that apply:: None/None on this list  Utilities: Low Risk (02/02/2024)   Received from [facility]   Utilities    In the past 12 months has the electric, gas, oil, or water company threatened to shut off services in your home? : No  Health Literacy: Not on file   Allergies[1]  Medications  (Not in a hospital admission)    Vitals   Vitals:   04/05/24 1400 04/05/24 1415 04/05/24 1553 04/05/24 1554  BP: (!) 192/145 (!) 187/83 (!) 210/80   Pulse: 74 80 70   Resp: (!) 23 14 20    Temp:    98 F (36.7 C)  TempSrc:    Oral  SpO2: 100% 100% 100%   Weight:      Height:         Body mass index is 16.14 kg/m.  Physical Exam   Constitutional: Appears uncomfortable and acutely ill Cardiovascular: Normal rate and regular rhythm.  Respiratory: Effort normal, non-labored breathing.   Neurologic Examination  Neuro: Mental Status: Patient is drowsy, awakens minimally to voice.  Answers questions with I don't know Intermittently follows simple commands only.  Cranial Nerves: II: PERRL. Blinks to threat bilaterally. .   III,IV, VI: EOMI without ptosis or diploplia.  V: Facial sensation is symmetric to light touch VII: Slight left facial droop (baseline per family) VIII: hearing is intact to voice X: Uvula elevates symmetrically XI: Shoulder shrug is symmetric. XII: tongue is midline without atrophy or fasciculations.  Motor: Tone is normal. Bulk is normal.  Generalized weakness LUE/LLE seem to be slightly weaker than RUE/RLE. Patient not cooperative with full motor exam. Sensory: Sensation is symmetric to light touch in the arms and legs. Cerebellar: Uncooperative  Labs   CBC:  Recent Labs  Lab 04/04/24 1254 04/04/24 1320 04/05/24 1425 04/05/24 1446  WBC 5.6  --  9.4  --   NEUTROABS 3.7  --  7.7  --   HGB 10.1*   < > 10.5* 12.2  HCT 31.2*   < > 32.6* 36.0   MCV 69.8*  --  70.0*  --   PLT 202  --  224  --    < > = values in this interval not displayed.    Basic Metabolic Panel:  Lab Results  Component Value Date   NA 140 04/05/2024   K 5.2 (H) 04/05/2024   CO2 28 04/04/2024   GLUCOSE 92 04/05/2024   BUN 13 04/05/2024   CREATININE 0.90 04/05/2024   CALCIUM  8.3 (L) 04/04/2024   GFRNONAA >60 04/04/2024   Lipid Panel: No results found for: LDLCALC HgbA1c: No results found for: HGBA1C Urine Drug Screen:     Component Value Date/Time   LABOPIA NEGATIVE 04/05/2024 1425   COCAINSCRNUR NEGATIVE 04/05/2024 1425   LABBENZ NEGATIVE 04/05/2024 1425   AMPHETMU NEGATIVE 04/05/2024 1425   THCU NEGATIVE 04/05/2024 1425   LABBARB NEGATIVE 04/05/2024 1425    Alcohol Level No results found for: ETH INR No results found for: INR APTT No results found for: APTT  CT Head without contrast(Personally reviewed): Interim development of 7 mm focal hyperdensity / hemorrhage within the subcortical right frontal lobe with mild surrounding edema.  No significant mass effect or midline shift. Atrophy and chronic small vessel ischemic changes of the white matter.  MRI Brain(Personally reviewed): pending  Impression   Allison Schmidt is a 78 y.o. female with hx of R frontal infarct a month ago (on DAPT currently), known aneurysms, HTN, HLD, Dementia (Aricept  started in October), PAD (on chronic DAPT for this) who was BIB family to ED d/t severe headache and continued altered mental status.  CTH completed today shows development of 7mm hemorrhage right frontal lobe with mild surrounding edema.  On exam, drowsy, confused, c/o severe right-sided headache, generalized  weakness with movement in all extremities with slightly weaker on left side, no noted sensory deficits, uncooperative with most testing.    Primary Diagnosis:  Nontraumatic intracerebral hemorrhage in hemisphere, subcortical Right frontal lobe, likely hemorrhagic transformation of infarct  secondary to hypertension  Secondary Diagnosis: Cerebral Edema Hypertensive emergency, Essential Hypertension  CKD 2  Recommendations  - Frequent Neuro checks per stroke unit protocol - MRI Brain stroke protocol - Surveillance CT 6-8 hours after CT today.  - TTE not needed as patient just had in October.  - Lipid panel: 100 (02/01/2024). Placed on Lipitor 80mg  - Statin - will be started if LDL>70 or otherwise medically indicated - A1C: 5.4 (02/01/2024) - Antithrombotic - None due to ICH - DVT ppx - SCDs - CBG AC HS when diet ordered, Q4 until. SSI.  - SBP goal - 130-150 for first 24 hours, then < 160 PRN labetalol  if HR>60 and PRN Hydralazine  if HR<60 Cleviprex  gtt ordered - Telemetry monitoring for arrhythmia - 72h - Swallow screen - will be performed prior to PO intake - Stroke education - will be given - PT/OT/SLP - Dispo: Admit to ICU   ______________________________________________________________________   Signed,  Rocky JAYSON Likes, NP [facility]   I have seen the patient and reviewed the above note.  She does have a small intraparenchymal hematoma that is new from her scan yesterday.  She has been very hypertensive so I think this is in the differential, but also possible would be a hemorrhagic conversion of a small ischemic infarct, or amyloid angiopathy.  BP control will need to be aggressive for the first 24 hours and she will need to be admitted to the intensive care unit for this.  Stroke team to follow.  This patient is critically ill and at significant risk of neurological worsening, death and care requires constant monitoring of vital signs, hemodynamics,respiratory and cardiac monitoring, neurological assessment, discussion with family, other specialists and medical decision making of high complexity. I spent 35 minutes of neurocritical care time  in the care of  this patient. This was time spent independent of any time provided by nurse practitioner  or PA.  Aisha Seals, MD [facility]   If 7pm- 7am, please page neurology on call as listed in AMION. 04/05/2024  7:23 PM      [1]  Allergies Allergen Reactions   Codeine

## 2024-04-06 ENCOUNTER — Inpatient Hospital Stay (HOSPITAL_COMMUNITY)

## 2024-04-06 DIAGNOSIS — Z7902 Long term (current) use of antithrombotics/antiplatelets: Secondary | ICD-10-CM | POA: Diagnosis not present

## 2024-04-06 DIAGNOSIS — I611 Nontraumatic intracerebral hemorrhage in hemisphere, cortical: Secondary | ICD-10-CM | POA: Diagnosis not present

## 2024-04-06 DIAGNOSIS — I10 Essential (primary) hypertension: Secondary | ICD-10-CM

## 2024-04-06 DIAGNOSIS — R569 Unspecified convulsions: Secondary | ICD-10-CM | POA: Diagnosis not present

## 2024-04-06 DIAGNOSIS — R29701 NIHSS score 1: Secondary | ICD-10-CM | POA: Diagnosis not present

## 2024-04-06 DIAGNOSIS — I69391 Dysphagia following cerebral infarction: Secondary | ICD-10-CM | POA: Diagnosis not present

## 2024-04-06 DIAGNOSIS — E785 Hyperlipidemia, unspecified: Secondary | ICD-10-CM | POA: Diagnosis not present

## 2024-04-06 DIAGNOSIS — G936 Cerebral edema: Secondary | ICD-10-CM | POA: Diagnosis not present

## 2024-04-06 DIAGNOSIS — Z7982 Long term (current) use of aspirin: Secondary | ICD-10-CM

## 2024-04-06 LAB — COMPREHENSIVE METABOLIC PANEL WITH GFR
ALT: 28 U/L (ref 0–44)
AST: 38 U/L (ref 15–41)
Albumin: 3.7 g/dL (ref 3.5–5.0)
Alkaline Phosphatase: 70 U/L (ref 38–126)
Anion gap: 12 (ref 5–15)
BUN: 15 mg/dL (ref 8–23)
CO2: 25 mmol/L (ref 22–32)
Calcium: 8.5 mg/dL — ABNORMAL LOW (ref 8.9–10.3)
Chloride: 101 mmol/L (ref 98–111)
Creatinine, Ser: 1.11 mg/dL — ABNORMAL HIGH (ref 0.44–1.00)
GFR, Estimated: 50 mL/min — ABNORMAL LOW
Glucose, Bld: 77 mg/dL (ref 70–99)
Potassium: 3.4 mmol/L — ABNORMAL LOW (ref 3.5–5.1)
Sodium: 139 mmol/L (ref 135–145)
Total Bilirubin: 0.6 mg/dL (ref 0.0–1.2)
Total Protein: 7.6 g/dL (ref 6.5–8.1)

## 2024-04-06 LAB — CBC
HCT: 30.9 % — ABNORMAL LOW (ref 36.0–46.0)
Hemoglobin: 10.4 g/dL — ABNORMAL LOW (ref 12.0–15.0)
MCH: 22.7 pg — ABNORMAL LOW (ref 26.0–34.0)
MCHC: 33.7 g/dL (ref 30.0–36.0)
MCV: 67.5 fL — ABNORMAL LOW (ref 80.0–100.0)
Platelets: 191 K/uL (ref 150–400)
RBC: 4.58 MIL/uL (ref 3.87–5.11)
RDW: 16.8 % — ABNORMAL HIGH (ref 11.5–15.5)
WBC: 6.4 K/uL (ref 4.0–10.5)
nRBC: 0.3 % — ABNORMAL HIGH (ref 0.0–0.2)

## 2024-04-06 MED ORDER — GADOBUTROL 1 MMOL/ML IV SOLN
4.0000 mL | Freq: Once | INTRAVENOUS | Status: AC | PRN
  Administered 2024-04-06: 4 mL via INTRAVENOUS

## 2024-04-06 MED ORDER — LORAZEPAM 2 MG/ML IJ SOLN
1.0000 mg | Freq: Once | INTRAMUSCULAR | Status: AC
  Administered 2024-04-06: 1 mg via INTRAVENOUS
  Filled 2024-04-06 (×2): qty 1

## 2024-04-06 MED ORDER — TIZANIDINE HCL 2 MG PO TABS: 2.0000 mg | ORAL_TABLET | Freq: Two times a day (BID) | ORAL | Status: DC | PRN

## 2024-04-06 MED ORDER — POTASSIUM CHLORIDE CRYS ER 20 MEQ PO TBCR
40.0000 meq | EXTENDED_RELEASE_TABLET | Freq: Once | ORAL | Status: AC
  Administered 2024-04-06: 40 meq via ORAL
  Filled 2024-04-06: qty 2

## 2024-04-06 MED ORDER — PANTOPRAZOLE SODIUM 40 MG PO TBEC
40.0000 mg | DELAYED_RELEASE_TABLET | Freq: Every day | ORAL | Status: DC
  Administered 2024-04-06: 40 mg via ORAL
  Filled 2024-04-06: qty 1

## 2024-04-06 MED ORDER — MUSCLE RUB 10-15 % EX CREA
TOPICAL_CREAM | CUTANEOUS | Status: DC | PRN
  Filled 2024-04-06: qty 85

## 2024-04-06 MED ORDER — IOHEXOL 350 MG/ML SOLN
75.0000 mL | Freq: Once | INTRAVENOUS | Status: AC | PRN
  Administered 2024-04-06: 75 mL via INTRAVENOUS

## 2024-04-06 MED ORDER — ENSURE PLUS HIGH PROTEIN PO LIQD
237.0000 mL | Freq: Two times a day (BID) | ORAL | Status: DC
  Administered 2024-04-06 – 2024-04-07 (×3): 237 mL via ORAL

## 2024-04-06 NOTE — Progress Notes (Signed)
 Patient is not available at the moment for EEG.  Nurse will alert tech when the patient will be available, tech will check back as well as schedule allows.

## 2024-04-06 NOTE — Progress Notes (Signed)
 Patient not available at the moment for EEG due to Pt taking patient for a walk.  EEG will come back as schedule allows.

## 2024-04-06 NOTE — Evaluation (Addendum)
 Speech Language Pathology Evaluation Patient Details Name: Allison Schmidt MRN: [id number] DOB: 28-Apr-1945 Today's Date: 04/06/2024 Time: 8399-8384 SLP Time Calculation (min) (ACUTE ONLY): 15 min  Problem List:  Patient Active Problem List   Diagnosis Date Noted   ICH (intracerebral hemorrhage) (HCC) 04/05/2024   Past Medical History:  Past Medical History:  Diagnosis Date   Blood transfusion without reported diagnosis    High cholesterol    Hypertension    Thalassemia    Past Surgical History:  Past Surgical History:  Procedure Laterality Date   BACK SURGERY     CATARACT EXTRACTION     HPI:  78 yo female presenting to ED 12/21 with AMS and severe headache. CTH shows 7 mm hemorrhage in the R frontal lobe. Recently admitted at OSH for R frontal CVA 01/2024 and seen by SLP for swallowing, who recommended regular/thin. PMH includes known aneurysms, HTN, HLD, dementia, PAD (on DAPT)   Assessment / Plan / Recommendation Clinical Impression  Pt reports being generally independent at home with her spouse, who does provide help with some ADLs. She presents with deficits related to memory, attention, problem solving, and reasoning evidenced by performance on administered subsections of the Cognistat. She has difficulty sustaining attention to task and was often perseverative on her dinner order. Her grandson reports cognitive-linguistic function seems similar to baseline with history of dementia. Ongoing SLP f/u is not needed acutely but would consider HH/OP interventions to preserve level of independence.     SLP Assessment  SLP Recommendation/Assessment: All further Speech Language Pathology needs can be addressed in the next venue of care SLP Visit Diagnosis: Cognitive communication deficit (R41.841)     Assistance Recommended at Discharge  Frequent or constant Supervision/Assistance  Functional Status Assessment Patient has had a recent decline in their functional status and  demonstrates the ability to make significant improvements in function in a reasonable and predictable amount of time.  Frequency and Duration           SLP Evaluation Cognition  Overall Cognitive Status: History of cognitive impairments - at baseline Arousal/Alertness: Awake/alert Orientation Level: Oriented to person;Oriented to place;Oriented to situation;Disoriented to time Attention: Sustained Sustained Attention: Impaired Sustained Attention Impairment: Verbal basic Memory: Impaired Memory Impairment: Retrieval deficit Awareness: Impaired Awareness Impairment: Emergent impairment Problem Solving: Impaired Problem Solving Impairment: Verbal basic Executive Function: Reasoning Reasoning: Impaired Reasoning Impairment: Verbal complex       Comprehension  Auditory Comprehension Overall Auditory Comprehension: Appears within functional limits for tasks assessed    Expression Expression Primary Mode of Expression: Verbal Verbal Expression Overall Verbal Expression: Appears within functional limits for tasks assessed   Oral / Motor  Oral Motor/Sensory Function Overall Oral Motor/Sensory Function: Within functional limits Motor Speech Overall Motor Speech: Appears within functional limits for tasks assessed            Damien Blumenthal, M.A., CCC-SLP Speech Language Pathology, Acute Rehabilitation Services  Secure Chat preferred [phone]  04/06/2024, 4:46 PM

## 2024-04-06 NOTE — Evaluation (Signed)
 Occupational Therapy Evaluation Patient Details Name: Allison Schmidt MRN: [id number] DOB: 1945-08-09 Today's Date: 04/06/2024   History of Present Illness   Patient is a 78 y/o female admitted 04/05/24 with AMS and severe headache.  She underwent CT head positive for 7 mm hemorrhage R frontal lobe.  Recent CVA R frontal infarct 10/25.  PMH positive for known aneurysms, HTN, HLD. Depemtia, PAD (on DAPT).     Clinical Impressions Kania was evaluated s/p the above admission list. She is mod I with use of DME at baseline. Upon evaluation the pt was limited by generalized weakness, BLE cramping pain, mild R inattention, perseveration on upcoming scans and decreased activity tolerance. Overall she needed generalized supervision A with Rw for mobility. Due to the deficits listed below the pt also needs up to generalized superivsion A for ADLs with cues for attention. Pt will benefit from continued acute OT services and HHOT.      If plan is discharge home, recommend the following:   A little help with walking and/or transfers;A little help with bathing/dressing/bathroom;Assistance with cooking/housework;Assist for transportation;Help with stairs or ramp for entrance     Functional Status Assessment   Patient has had a recent decline in their functional status and demonstrates the ability to make significant improvements in function in a reasonable and predictable amount of time.     Equipment Recommendations   None recommended by OT      Precautions/Restrictions   Precautions Precautions: Fall Precaution/Restrictions Comments: SBP 130-150 first 24 hours then <160 Restrictions Weight Bearing Restrictions Per Provider Order: No     Mobility Bed Mobility Overal bed mobility: Modified Independent                  Transfers Overall transfer level: Needs assistance Equipment used: Rolling walker (2 wheels) Transfers: Sit to/from Stand Sit to Stand: Supervision                   Balance Overall balance assessment: Needs assistance   Sitting balance-Leahy Scale: Good       Standing balance-Leahy Scale: Fair                             ADL either performed or assessed with clinical judgement   ADL Overall ADL's : Needs assistance/impaired Eating/Feeding: Independent   Grooming: Supervision/safety;Standing   Upper Body Bathing: Set up;Sitting   Lower Body Bathing: Supervison/ safety;Sit to/from stand   Upper Body Dressing : Set up;Sitting   Lower Body Dressing: Supervision/safety;Sit to/from stand   Toilet Transfer: Supervision/safety;Ambulation;Regular Toilet;Rolling walker (2 wheels)   Toileting- Clothing Manipulation and Hygiene: Modified independent;Sitting/lateral lean       Functional mobility during ADLs: Supervision/safety;Rolling walker (2 wheels) General ADL Comments: generalized supervision for safety only, R inattention noted - pt was bumping into things on the R and had a r drift during hallway mobility     Vision Baseline Vision/History: 0 No visual deficits Vision Assessment?: No apparent visual deficits     Perception Perception: Impaired Preception Impairment Details: Inattention/Neglect Perception-Other Comments: R   Praxis Praxis: Not tested       Pertinent Vitals/Pain Pain Assessment Pain Assessment: Faces Faces Pain Scale: Hurts a little bit Pain Location: BLE - cramping Pain Descriptors / Indicators: Cramping Pain Intervention(s): Limited activity within patient's tolerance, Monitored during session     Extremity/Trunk Assessment Upper Extremity Assessment Upper Extremity Assessment: Generalized weakness;Overall [facility] for tasks assessed   Lower Extremity  Assessment Lower Extremity Assessment: Defer to PT evaluation   Cervical / Trunk Assessment Cervical / Trunk Assessment: Normal   Communication Communication Communication: No apparent difficulties   Cognition Arousal:  Alert Behavior During Therapy: WFL for tasks assessed/performed, Anxious Cognition: Cognition impaired           Executive functioning impairment (select all impairments): Problem solving OT - Cognition Comments: internally distracted, needed cues for attention, awareness and safety. perseverating on upcoming scans                 Following commands: Intact       Cueing  General Comments   Cueing Techniques: Verbal cues  VSS, pts legs were cramping but improved with mobility   Exercises     Shoulder Instructions      Home Living Family/patient expects to be discharged to:: Private residence Living Arrangements: Spouse/significant other;Other relatives Available Help at Discharge: Family Type of Home: House Home Access: Stairs to enter Entrance Stairs-Number of Steps: 1   Home Layout: Two level;Able to live on main level with bedroom/bathroom Alternate Level Stairs-Number of Steps: grandaughter stays upstairs   Bathroom Shower/Tub: Producer, Television/film/video: Standard     Home Equipment: Shower seat;Grab bars - tub/shower;Hand held shower head          Prior Functioning/Environment Prior Level of Function : Independent/Modified Independent             Mobility Comments: using walker at home ADLs Comments: spouse cooks, has a Artist Problem List: Decreased strength;Impaired balance (sitting and/or standing);Decreased range of motion;Decreased activity tolerance;Decreased safety awareness;Decreased knowledge of use of DME or AE;Decreased knowledge of precautions   OT Treatment/Interventions: Self-care/ADL training;Therapeutic exercise;Neuromuscular education;DME and/or AE instruction;Therapeutic activities;Patient/family education;Balance training      OT Goals(Current goals can be found in the care plan section)   Acute Rehab OT Goals Patient Stated Goal: to rest OT Goal Formulation: With patient Time For Goal Achievement:  04/20/24 Potential to Achieve Goals: Good ADL Goals Pt/caregiver will Perform Home Exercise Program: Increased strength;Both right and left upper extremity;With written HEP provided Additional ADL Goal #1: Pt will complete all ADLs with mod I and LRAD   OT Frequency:  Min 2X/week    Co-evaluation              AM-PAC OT 6 Clicks Daily Activity     Outcome Measure Help from another person eating meals?: None Help from another person taking care of personal grooming?: A Little Help from another person toileting, which includes using toliet, bedpan, or urinal?: A Little Help from another person bathing (including washing, rinsing, drying)?: A Little Help from another person to put on and taking off regular upper body clothing?: A Little Help from another person to put on and taking off regular lower body clothing?: A Little 6 Click Score: 19   End of Session Equipment Utilized During Treatment: Rolling walker (2 wheels) Nurse Communication: Mobility status  Activity Tolerance: Patient tolerated treatment well Patient left: in bed;with call bell/phone within reach;with bed alarm set;with family/visitor present  OT Visit Diagnosis: Unsteadiness on feet (R26.81);Other abnormalities of gait and mobility (R26.89);Muscle weakness (generalized) (M62.81)                Time: 8556-8496 OT Time Calculation (min): 20 min Charges:  OT General Charges $OT Visit: 1 Visit OT Evaluation $OT Eval Moderate Complexity: 1 Mod  Lucie Kendall, OTR/L Acute Rehabilitation Services Office [phone] Secure Chat Communication  Preferred   [name] 04/06/2024, 4:06 PM

## 2024-04-06 NOTE — Progress Notes (Signed)
 STROKE TEAM PROGRESS NOTE   INTERIM HISTORY/SUBJECTIVE Patient sitting in the chair in no apparent distress.  RN at the bedside.  Patient was unable to tolerate MRI, so recommend repeating MRI with and without today.  Will give her some Ativan  to help with her claustrophobia.  Will also check CT chest abdomen and pelvis for malignancy screening.  Potassium is low at 3.4 will replace Patient is noted to have a left facial twitching, patient states has been happening for a while.  RN states she also had some foot spasm where her foot was turned out for quite a while.  Will check routine EEG today  CBC    Component Value Date/Time   WBC 6.4 04/06/2024 0243   RBC 4.58 04/06/2024 0243   HGB 10.4 (L) 04/06/2024 0243   HCT 30.9 (L) 04/06/2024 0243   PLT 191 04/06/2024 0243   MCV 67.5 (L) 04/06/2024 0243   MCH 22.7 (L) 04/06/2024 0243   MCHC 33.7 04/06/2024 0243   RDW 16.8 (H) 04/06/2024 0243   LYMPHSABS 1.1 04/05/2024 1425   MONOABS 0.6 04/05/2024 1425   EOSABS 0.0 04/05/2024 1425   BASOSABS 0.0 04/05/2024 1425    BMET    Component Value Date/Time   NA 139 04/06/2024 0243   K 3.4 (L) 04/06/2024 0243   CL 101 04/06/2024 0243   CO2 25 04/06/2024 0243   GLUCOSE 77 04/06/2024 0243   BUN 15 04/06/2024 0243   CREATININE 1.11 (H) 04/06/2024 0243   CALCIUM  8.5 (L) 04/06/2024 0243   GFRNONAA 50 (L) 04/06/2024 0243    IMAGING past 24 hours CT HEAD POST STROKE FOLLOWUP/TIMED/STAT READ Result Date: 04/05/2024 CLINICAL DATA:  Follow-up examination for stroke. EXAM: CT HEAD WITHOUT CONTRAST TECHNIQUE: Contiguous axial images were obtained from the base of the skull through the vertex without intravenous contrast. RADIATION DOSE REDUCTION: This exam was performed according to the departmental dose-optimization program which includes automated exposure control, adjustment of the mA and/or kV according to patient size and/or use of iterative reconstruction technique. COMPARISON:  Comparison made  with prior CT and MRI from earlier the same day. FINDINGS: Brain: Previously identified 7 mm parenchymal hemorrhage involving the cortical subcortical right frontal lobe again seen, stable from prior (series 4, image 26). Minimal surrounding edema without significant regional mass effect. Additional focus involving the right parietal lobe seen on prior MRI not visible by CT. No new intracranial hemorrhage. No other acute large vessel territory infarct. No mass lesion or midline shift. No hydrocephalus or extra-axial fluid collection. 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 Vascular: No abnormal hyperdense vessel. Scattered vascular calcifications noted within the carotid siphons. Skull: Scalp soft tissues demonstrate no acute finding. Calvarium intact. Sinuses/Orbits: Globes orbital soft tissues within normal limits. Paranasal sinuses and mastoid air cells remain clear. Other: None. IMPRESSION: 1. Stable 7 mm parenchymal hemorrhage involving the cortical to subcortical right frontal lobe. No significant regional mass effect. 2. Additional focus involving the right parietal lobe seen on prior MRI not visible by CT. 3. No other new acute intracranial abnormality. 4. 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 Electronically Signed   By: Morene Hoard M.D.   On: 04/05/2024 21:27   MR BRAIN WO CONTRAST Result Date: 04/05/2024 EXAM: MRI BRAIN WITHOUT CONTRAST 04/05/2024 06:11:28 PM TECHNIQUE: Multiplanar multisequence MRI of the head/brain was performed without the administration of intravenous contrast. COMPARISON: CT head without contrast 04/05/2024 and CT angio head  and neck 04/04/2024. CLINICAL HISTORY: Neuro deficit, acute, stroke suspected. FINDINGS: BRAIN AND VENTRICLES: No acute infarct. 2 hemorrhagic foci are present in the right hemisphere. The larger corresponds with the lesion  identified in the posterior right frontal lobe at CT. A second cortical or subcortical hemorrhagic focus is present in the right parietal lobe. Remote infarct in the anterior left frontal lobe demonstrates some blood products. Periventricular and subcortical T2 hyperintensities are otherwise moderately advanced for age. No mass. No midline shift. No hydrocephalus. The sella is unremarkable. Normal flow voids. ORBITS: Insert lens replacement. SINUSES AND MASTOIDS: A right mastoid effusion is present. No obstructing nasopharyngeal lesion is present. BONES AND SOFT TISSUES: Normal marrow signal. No acute soft tissue abnormality. IMPRESSION: 1. Hemorrhagic foci in the right frontal and right parietal lobes, with the larger right frontal focus corresponding to the lesion identified on prior CT. The differential diagnosis includes small infarcts with interval hemorrhagic conversion (most likely) and metastatic disease. 2. Remote infarct in the anterior left frontal lobe with some blood products. 3. Moderately advanced ventricular and subcortical T2 hyperintensities for age. 4. When the patient's condition allows, repeat MRI with and without contrast may be useful for further evaluation. Electronically signed by: Lonni Necessary MD 04/05/2024 06:27 PM EST RP Workstation: [id number]   CT HEAD WO CONTRAST Result Date: 04/05/2024 CLINICAL DATA:  Headache hypertension EXAM: CT HEAD WITHOUT CONTRAST TECHNIQUE: Contiguous axial images were obtained from the base of the skull through the vertex without intravenous contrast. RADIATION DOSE REDUCTION: This exam was performed according to the departmental dose-optimization program which includes automated exposure control, adjustment of the mA and/or kV according to patient size and/or use of iterative reconstruction technique. COMPARISON:  CT 04/04/2024 FINDINGS: Brain: Interim development of focal hyperdensity within the subcortical right frontal lobe, this measures 6.7 x  7 x 6.8 mm on series 3, image 26 and coronal series 5, image 33. Mild surrounding hypodense edema. No significant mass effect or midline shift. Atrophy and moderate chronic small vessel ischemic changes of the white matter. Bilateral basal ganglial calcifications. Stable ventricle size. Vascular: No hyperdense vessels. Carotid and vascular calcification Skull: No fracture Sinuses/Orbits: No acute finding. Other: None IMPRESSION: 1. Interim development of 7 mm focal hyperdensity / hemorrhage within the subcortical right frontal lobe with mild surrounding edema. No significant mass effect or midline shift. 2. Atrophy and chronic small vessel ischemic changes of the white matter. Critical Value/emergent results were called by telephone at the time of interpretation on 04/05/2024 at 3:20 pm to provider nurse Caitlyn for [facility] , who verbally acknowledged these results. Electronically Signed   By: Luke Bun M.D.   On: 04/05/2024 15:21   DG Chest Portable 1 View Result Date: 04/05/2024 CLINICAL DATA:  Altered mental status. EXAM: PORTABLE CHEST 1 VIEW COMPARISON:  Chest radiograph dated 04/04/2024. FINDINGS: No focal consolidation, pleural effusion, pneumothorax. The cardiac silhouette is within normal limits. Atherosclerotic calcification of the aorta. No acute osseous pathology. IMPRESSION: No active disease. Electronically Signed   By: Vanetta Chou M.D.   On: 04/05/2024 14:34    Vitals:   04/06/24 0835 04/06/24 0900 04/06/24 1000 04/06/24 1030  BP: 129/79 (!) 125/54 122/65 130/82  Pulse: 72 63 77 64  Resp:  19 20 (!) 29  Temp:      TempSrc:      SpO2:  95% 98% 98%  Weight:      Height:         PHYSICAL EXAM General:  Alert, well-nourished,  well-developed patient in no acute distress Psych:  Mood and affect appropriate for situation CV: Regular rate and rhythm on monitor Respiratory:  Regular, unlabored respirations on room air GI: Abdomen soft and nontender   NEURO:  Mental  Status: AA&Ox3, patient is able to give clear and coherent history Speech/Language: speech is without dysarthria or aphasia.  Naming, repetition, fluency, and comprehension intact.  Cranial Nerves:  II: PERRL. Visual fields full.  III, IV, VI: EOMI. Eyelids elevate symmetrically.  V: Sensation is intact to light touch and symmetrical to face.  VII: Left facial droop, left rhythmic facial twitching noted VIII: hearing intact to voice. IX, X: Palate elevates symmetrically. Phonation is normal.  KP:Dynloizm shrug 5/5. XII: tongue is midline without fasciculations. Motor: 5/5 strength to all muscle groups tested.  Tone: is normal and bulk is normal Sensation- Intact to light touch bilaterally. Extinction absent to light touch to DSS.   Coordination: FTN intact bilaterally, HKS: no ataxia in BLE.No drift.  Gait- deferred  Most Recent NIH  1a Level of Conscious.:  1b LOC Questions:  1c LOC Commands:  2 Best Gaze:  3 Visual:  4 Facial Palsy: 1 5a Motor Arm - left:  5b Motor Arm - Right:  6a Motor Leg - Left:  6b Motor Leg - Right:  7 Limb Ataxia:  8 Sensory:  9 Best Language:  10 Dysarthria:  11 Extinct. and Inatten.:  TOTAL: 1   ASSESSMENT/PLAN  Ms. Allison Schmidt is a 78 y.o. 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  NIH on Admission 13  Intracerebral Hemorrhage:  right frontal lobe with mild edema  Etiology: Likely hypertensive, workup underway CT head 1. Interim development of 7 mm focal hyperdensity / hemorrhage within the subcortical right frontal lobe with mild surrounding edema. No significant mass effect or midline shift.2. Atrophy and chronic small vessel ischemic changes of the white matter. MRI  1. Hemorrhagic foci in the right frontal and right parietal  lobes, with the larger right frontal focus corresponding to the lesion identified on prior CT. The differential diagnosis includes small infarcts with interval hemorrhagic conversion (most likely) and metastatic disease.2. Remote infarct in the anterior left frontal lobe with some blood products. 3. Moderately advanced ventricular and subcortical T2 hyperintensities for age. Repeat CT head stable Will repeat MRI brain with and without due to a lot of motion artifact CT chest abdomen pelvis with contrast for malignancy screening 2D Echo 01/2024-EF 40 to 45% LDL 01/2024-100 HgbA1c 01/2024 5.4 VTE prophylaxis -SCDs aspirin  81 mg daily and clopidogrel 75 mg daily prior to admission, now on No antithrombotic due to ICH Therapy recommendations:  Pending Disposition: Pending  Hx of Stroke/TIA Middle cerebral aneurysm Insomnia Anxiety and depression Dementia 01/2024 cortical posterior right frontal infarct, workup completed at [facility] Continue home Aricept   Hypertension Home meds: Coreg  12.5 mg, Stable Required Cleviprex  drip for a little now off SBP <150 for 24 hours  Hyperlipidemia Home meds: Atorvastatin  40 mg,  resumed in hospital LDL 100 goal < 70 Per chart review patient unable to tolerate higher doses of statin Continue statin at discharge  Dysphagia Patient has post-stroke dysphagia, SLP consulted    Diet   Diet regular Room service appropriate? Yes; Fluid consistency: Thin   Advance diet as tolerated  Hypokalemia K3.4, replaced Will repeat in the morning  Other Stroke Risk Factors Elderly  Other Active Problems Thalassemia Iron deficient anemia GERD PAD  Hospital day # 1   Karna Geralds DNP, ACNPC-AG  [facility]    To contact Stroke Continuity provider, please refer to [email or web address]. After hours, contact General Neurology

## 2024-04-06 NOTE — Evaluation (Signed)
 Physical Therapy Evaluation Patient Details Name: Allison Schmidt MRN: [id number] DOB: [birth year] Today's Date: 04/06/2024  History of Present Illness  Patient is a 78 y/o female admitted 04/05/24 with AMS and severe headache.  She underwent CT head positive for 7 mm hemorrhage R frontal lobe.  Recent CVA R frontal infarct 10/25.  PMH positive for known aneurysms, HTN, HLD. Depemtia, PAD (on DAPT).  Clinical Impression  Patient presents with decreased mobility due to generalized weakness, decreased balance and decreased safety awareness.  Previously ambulatory at home with RW and spouse performing some IADL's.  She was getting HHPT after recent rehab stay prior to d/c home.  Currently CGA to S for mobility in hallway with RW and for transfers and bed mobility.  She will benefit from skilled PT In the acute setting and to resume HHPT at d/c.         If plan is discharge home, recommend the following: A little help with walking and/or transfers;A little help with bathing/dressing/bathroom   Can travel by private vehicle        Equipment Recommendations None recommended by PT  Recommendations for Other Services       Functional Status Assessment Patient has had a recent decline in their functional status and demonstrates the ability to make significant improvements in function in a reasonable and predictable amount of time.     Precautions / Restrictions Precautions Precautions: Fall Precaution/Restrictions Comments: SBP 130-150 first 24 hours then <160      Mobility  Bed Mobility Overal bed mobility: Modified Independent             General bed mobility comments: sit to supine    Transfers Overall transfer level: Needs assistance Equipment used: Rolling walker (2 wheels) Transfers: Sit to/from Stand Sit to Stand: Supervision           General transfer comment: assist for lines/safety    Ambulation/Gait Ambulation/Gait assistance: Supervision Gait Distance (Feet):  150 Feet   Gait Pattern/deviations: Step-through pattern, Decreased stride length       General Gait Details: veering L and reports walker not going straight making efforts to correct without assist  Stairs            Wheelchair Mobility     Tilt Bed    Modified Rankin (Stroke Patients Only) Modified Rankin (Stroke Patients Only) Pre-Morbid Rankin Score: Moderate disability Modified Rankin: Moderately severe disability     Balance Overall balance assessment: Needs assistance   Sitting balance-Leahy Scale: Good       Standing balance-Leahy Scale: Fair Standing balance comment: static standing without UE support                             Pertinent Vitals/Pain Pain Assessment Pain Assessment: No/denies pain    Home Living Family/patient expects to be discharged to:: Private residence Living Arrangements: Spouse/significant other;Other relatives engineer, building services) Available Help at Discharge: Family Type of Home: House Home Access: Stairs to enter   [employer] of Steps: 1 Alternate Level Stairs-Number of Steps: grandaughter stays upstairs Home Layout: Two level;Able to live on main level with bedroom/bathroom Home Equipment: Shower seat;Grab bars - tub/shower;Hand held shower head      Prior Function Prior Level of Function : Independent/Modified Independent             Mobility Comments: using walker at home ADLs Comments: spouse cooks, has a Emergency Planning/management Officer  Extremity Assessment Upper Extremity Assessment: Defer to OT evaluation    Lower Extremity Assessment Lower Extremity Assessment: Generalized weakness    Cervical / Trunk Assessment Cervical / Trunk Assessment: Normal (cachexia)  Communication   Communication Communication: No apparent difficulties    Cognition Arousal: Alert Behavior During Therapy: WFL for tasks assessed/performed   PT - Cognitive impairments:  Orientation                       PT - Cognition Comments: stated Sunday, but knew in hospital for stroke Following commands: Intact       Cueing Cueing Techniques: Verbal cues     General Comments General comments (skin integrity, edema, etc.): VSS with mobility, EEG technician arrived for testing, pt back to bed; RN reports when pt initially getting up had significant cramp in L foot with turned inward and she helped with soaking it, massaging it and walking to bathroom.  Encouraged more of same if recurs    Exercises     Assessment/Plan    PT Assessment Patient needs continued PT services  PT Problem List Decreased mobility;Decreased balance;Decreased activity tolerance;Decreased strength       PT Treatment Interventions DME instruction;Gait training;Patient/family education;Functional mobility training;Therapeutic activities;Therapeutic exercise;Balance training    PT Goals (Current goals can be found in the Care Plan section)  Acute Rehab PT Goals Patient Stated Goal: return home to eat chicken PT Goal Formulation: With patient Time For Goal Achievement: 04/20/24 Potential to Achieve Goals: Good    Frequency Min 2X/week     Co-evaluation               AM-PAC PT 6 Clicks Mobility  Outcome Measure Help needed turning from your back to your side while in a flat bed without using bedrails?: A Little Help needed moving from lying on your back to sitting on the side of a flat bed without using bedrails?: A Little Help needed moving to and from a bed to a chair (including a wheelchair)?: A Little Help needed standing up from a chair using your arms (e.g., wheelchair or bedside chair)?: A Little Help needed to walk in hospital room?: A Little Help needed climbing 3-5 steps with a railing? : A Little 6 Click Score: 18    End of Session Equipment Utilized During Treatment: Gait belt Activity Tolerance: Patient tolerated treatment well Patient left: in  bed;with call bell/phone within reach;Other (comment) (EEG technician in the room)   PT Visit Diagnosis: Other abnormalities of gait and mobility (R26.89)    Time: 8868-8842 PT Time Calculation (min) (ACUTE ONLY): 26 min   Charges:   PT Evaluation $PT Eval Moderate Complexity: 1 Mod PT Treatments $Gait Training: 8-22 mins PT General Charges $$ ACUTE PT VISIT: 1 Visit         Micheline Portal, PT Acute Rehabilitation Services Office:[phone] 04/06/2024   Montie Portal 04/06/2024, 12:10 PM

## 2024-04-06 NOTE — Progress Notes (Signed)
 EEG complete - results pending

## 2024-04-06 NOTE — Progress Notes (Signed)
 Initial Nutrition Assessment  DOCUMENTATION CODES:   Severe malnutrition in context of social or environmental circumstances, Underweight  INTERVENTION:   Ensure Plus High Protein po BID, each supplement provides 350 kcal and 20 grams of protein.  Magic cup BID with meals, each supplement provides 290 kcal and 9 grams of protein  Encourage PO intake, reviewed menu options and meal ordering process with pt  NUTRITION DIAGNOSIS:   Severe Malnutrition related to social / environmental circumstances as evidenced by severe muscle depletion, severe fat depletion.  GOAL:   Patient will meet greater than or equal to 90% of their needs  MONITOR:   PO intake, Supplement acceptance  REASON FOR ASSESSMENT:   Consult Assessment of nutrition requirement/status  ASSESSMENT:   Pt with PMH of R frontal infarct 10/25, known aneurysms, HTN, HLD, dementia (started on aricept  10/25), and PAD admitted with R frontal lobe ICH with mild edema, likely hypertensive.   Pt discussed during ICU rounds and with RN and MD.  Pt has been unable to get MRI yet due to aborting early. Per pt she is unable to lay in machine that long. Plan for CT of chest, abd, and pelvis for malignancy screening.    Spoke with pt and her husband who is at bedside. Pt uses a rolling walker to get around home and at dr visits. Husband orders groceries and picks them up. Pt reports that in the past she weighed 134 lb and lost down to 94 lb after a nose surgery (?2022) because she couldn't eat. She thinks she is about 100 lb now. Current weight is 90.6 lb.  Pt reports that she has been eating like a dog and has a good appetite. Pt ate a 6 inch subway sandwich for lunch that family brought.  She likes strawberry ensure but is willing to drink chocolate.     Medications reviewed and include: protonix , senokot-s   Labs reviewed:  K 3.4    NUTRITION - FOCUSED PHYSICAL EXAM:  Flowsheet Row Most Recent Value  Orbital  Region Severe depletion  Upper Arm Region Severe depletion  Thoracic and Lumbar Region Severe depletion  Buccal Region Severe depletion  Temple Region Severe depletion  Clavicle Bone Region Severe depletion  Clavicle and Acromion Bone Region Severe depletion  Scapular Bone Region Severe depletion  Dorsal Hand Severe depletion  Patellar Region Severe depletion  Anterior Thigh Region Severe depletion  Posterior Calf Region Severe depletion  Edema (RD Assessment) None  Hair Reviewed  Eyes Reviewed  Mouth Reviewed  Skin Reviewed  Nails Reviewed    Diet Order:   Diet Order             Diet regular Room service appropriate? Yes; Fluid consistency: Thin  Diet effective now                   EDUCATION NEEDS:   Education needs have been addressed  Skin:  Skin Assessment: Reviewed RN Assessment  Last BM:  unknown  Height:   Ht Readings from Last 1 Encounters:  04/05/24 5' 5 (1.651 m)    Weight:   Wt Readings from Last 1 Encounters:  04/05/24 41.1 kg    BMI:  Body mass index is 15.08 kg/m.  Estimated Nutritional Needs:   Kcal:  1300-1500  Protein:  50-65 grams  Fluid:  >1.3 L/day  Powell SQUIBB., RD, LDN, CNSC See AMiON for contact information

## 2024-04-06 NOTE — Procedures (Signed)
 Patient Name: Allison Schmidt  MRN: [id number]  Epilepsy Attending: Arlin MALVA Krebs  Referring Physician/Provider: Waddell Karna LABOR, NP  Date: 04/06/2024 Duration: 21.49 mins  Patient history:  78 y.o. female with severe headache and continued altered mental status. EEG to evaluate for seizure  Level of alertness: Awake  AEDs during EEG study: None  Technical aspects: This EEG study was done with scalp electrodes positioned according to the 10-20 International system of electrode placement. Electrical activity was reviewed with band pass filter of 1-70Hz , sensitivity of 7 uV/mm, display speed of 68mm/sec with a 60Hz  notched filter applied as appropriate. EEG data were recorded continuously and digitally stored.  Video monitoring was available and reviewed as appropriate.  Description: The posterior dominant rhythm consists of 8-9 Hz activity of moderate voltage (25-35 uV) seen predominantly in posterior head regions, symmetric and reactive to eye opening and eye closing. Hyperventilation and photic stimulation were not performed.     IMPRESSION: This study is within normal limits. No seizures or epileptiform discharges were seen throughout the recording.  A normal interictal EEG does not exclude the diagnosis of epilepsy.   Shian Goodnow O Joseguadalupe Stan

## 2024-04-06 NOTE — Progress Notes (Signed)
 OT Cancellation Note  Patient Details Name: Allison Schmidt MRN: [id number] DOB: 06-Dec-1945   Cancelled Treatment:    Reason Eval/Treat Not Completed: Active bedrest order (x24 hours at 12/21 1640)  [name] 04/06/2024, 7:52 AM

## 2024-04-07 ENCOUNTER — Other Ambulatory Visit (HOSPITAL_COMMUNITY): Payer: Self-pay

## 2024-04-07 DIAGNOSIS — I611 Nontraumatic intracerebral hemorrhage in hemisphere, cortical: Secondary | ICD-10-CM | POA: Diagnosis not present

## 2024-04-07 LAB — CBC
HCT: 28.9 % — ABNORMAL LOW (ref 36.0–46.0)
Hemoglobin: 9.6 g/dL — ABNORMAL LOW (ref 12.0–15.0)
MCH: 22.5 pg — ABNORMAL LOW (ref 26.0–34.0)
MCHC: 33.2 g/dL (ref 30.0–36.0)
MCV: 67.7 fL — ABNORMAL LOW (ref 80.0–100.0)
Platelets: 167 K/uL (ref 150–400)
RBC: 4.27 MIL/uL (ref 3.87–5.11)
RDW: 17.1 % — ABNORMAL HIGH (ref 11.5–15.5)
WBC: 4.9 K/uL (ref 4.0–10.5)
nRBC: 0 % (ref 0.0–0.2)

## 2024-04-07 LAB — COMPREHENSIVE METABOLIC PANEL WITH GFR
ALT: 24 U/L (ref 0–44)
AST: 29 U/L (ref 15–41)
Albumin: 3.5 g/dL (ref 3.5–5.0)
Alkaline Phosphatase: 61 U/L (ref 38–126)
Anion gap: 10 (ref 5–15)
BUN: 24 mg/dL — ABNORMAL HIGH (ref 8–23)
CO2: 25 mmol/L (ref 22–32)
Calcium: 8.9 mg/dL (ref 8.9–10.3)
Chloride: 105 mmol/L (ref 98–111)
Creatinine, Ser: 1.04 mg/dL — ABNORMAL HIGH (ref 0.44–1.00)
GFR, Estimated: 54 mL/min — ABNORMAL LOW
Glucose, Bld: 77 mg/dL (ref 70–99)
Potassium: 3.9 mmol/L (ref 3.5–5.1)
Sodium: 140 mmol/L (ref 135–145)
Total Bilirubin: 0.4 mg/dL (ref 0.0–1.2)
Total Protein: 6.9 g/dL (ref 6.5–8.1)

## 2024-04-07 MED ORDER — HEPARIN SODIUM (PORCINE) 5000 UNIT/ML IJ SOLN
5000.0000 [IU] | Freq: Three times a day (TID) | INTRAMUSCULAR | Status: DC
  Administered 2024-04-07: 5000 [IU] via SUBCUTANEOUS
  Filled 2024-04-07: qty 1

## 2024-04-07 MED ORDER — ASPIRIN 81 MG PO CHEW
81.0000 mg | CHEWABLE_TABLET | Freq: Every day | ORAL | 0 refills | Status: AC
  Filled 2024-04-07: qty 120, 120d supply, fill #0

## 2024-04-07 NOTE — Plan of Care (Signed)
" °  Problem: Education: Goal: Knowledge of disease or condition will improve Outcome: Progressing   Problem: Intracerebral Hemorrhage Tissue Perfusion: Goal: Complications of Intracerebral Hemorrhage will be minimized Outcome: Progressing   Problem: Coping: Goal: Will verbalize positive feelings about self Outcome: Progressing Goal: Will identify appropriate support needs Outcome: Progressing   Problem: Health Behavior/Discharge Planning: Goal: Ability to manage health-related needs will improve Outcome: Progressing   Problem: Self-Care: Goal: Ability to participate in self-care as condition permits will improve Outcome: Progressing Goal: Verbalization of feelings and concerns over difficulty with self-care will improve Outcome: Progressing Goal: Ability to communicate needs accurately will improve Outcome: Progressing   Problem: Nutrition: Goal: Risk of aspiration will decrease Outcome: Progressing Goal: Dietary intake will improve Outcome: Progressing   Problem: Education: Goal: Knowledge of General Education information will improve Description: Including pain rating scale, medication(s)/side effects and non-pharmacologic comfort measures Outcome: Progressing   Problem: Health Behavior/Discharge Planning: Goal: Ability to manage health-related needs will improve Outcome: Progressing   Problem: Clinical Measurements: Goal: Ability to maintain clinical measurements within normal limits will improve Outcome: Progressing Goal: Diagnostic test results will improve Outcome: Progressing Goal: Respiratory complications will improve Outcome: Progressing   Problem: Activity: Goal: Risk for activity intolerance will decrease Outcome: Progressing   Problem: Nutrition: Goal: Adequate nutrition will be maintained Outcome: Progressing   Problem: Coping: Goal: Level of anxiety will decrease Outcome: Progressing   "

## 2024-04-07 NOTE — Progress Notes (Signed)
 Physical Therapy Treatment Patient Details Name: Allison Schmidt MRN: [id number] DOB: Jan 07, 1946 Today's Date: 04/07/2024   History of Present Illness 78 yo F adm 04/05/24 with AMS and severe HA, Rt frontal lobe hemorrhage.  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    PT Comments  Pt pleasant stating HA and being cold limiting her function. Pt able to perform mobility with supervision to CGA, family support and active with HHPT. Pt appropriate for return home with continuation of current HHPT.     If plan is discharge home, recommend the following: A little help with walking and/or transfers;A little help with bathing/dressing/bathroom   Can travel by private vehicle        Equipment Recommendations  None recommended by PT    Recommendations for Other Services       Precautions / Restrictions Precautions Precautions: Fall Precaution/Restrictions Comments: SBP  <160     Mobility  Bed Mobility Overal bed mobility: Needs Assistance Bed Mobility: Supine to Sit     Supine to sit: Supervision, HOB elevated     General bed mobility comments: increased time, cues to initiate, HOB 30 degrees    Transfers Overall transfer level: Needs assistance   Transfers: Sit to/from Stand Sit to Stand: Contact guard assist           General transfer comment: cues for hand placement and safety    Ambulation/Gait Ambulation/Gait assistance: Supervision Gait Distance (Feet): 150 Feet Assistive device: Rolling walker (2 wheels) Gait Pattern/deviations: Step-through pattern, Decreased stride length, Trunk flexed   Gait velocity interpretation: <1.8 ft/sec, indicate of risk for recurrent falls   General Gait Details: veering Lt with cues to avoid obstacles, cues for direction   Stairs             Wheelchair Mobility     Tilt Bed    Modified Rankin (Stroke Patients Only) Modified Rankin (Stroke Patients Only) Pre-Morbid Rankin Score: Moderate  disability Modified Rankin: Moderately severe disability     Balance Overall balance assessment: Needs assistance   Sitting balance-Leahy Scale: Good     Standing balance support: Bilateral upper extremity supported, During functional activity, Reliant on assistive device for balance Standing balance-Leahy Scale: Fair Standing balance comment: RW for gait                            Communication Communication Communication: No apparent difficulties  Cognition Arousal: Alert Behavior During Therapy: WFL for tasks assessed/performed   PT - Cognitive impairments: Orientation, Safety/Judgement                         Following commands: Intact      Cueing Cueing Techniques: Verbal cues  Exercises      General Comments        Pertinent Vitals/Pain Pain Assessment Pain Assessment: 0-10 Pain Score: 7  Pain Location: HA Pain Descriptors / Indicators: Aching Pain Intervention(s): Limited activity within patient's tolerance, Monitored during session, Repositioned    Home Living                          Prior Function            PT Goals (current goals can now be found in the care plan section) Progress towards PT goals: Progressing toward goals    Frequency    Min 1X/week      PT  Plan      Co-evaluation              AM-PAC PT 6 Clicks Mobility   Outcome Measure  Help needed turning from your back to your side while in a flat bed without using bedrails?: None Help needed moving from lying on your back to sitting on the side of a flat bed without using bedrails?: A Little Help needed moving to and from a bed to a chair (including a wheelchair)?: A Little Help needed standing up from a chair using your arms (e.g., wheelchair or bedside chair)?: A Little Help needed to walk in hospital room?: A Little Help needed climbing 3-5 steps with a railing? : A Little 6 Click Score: 19    End of Session   Activity Tolerance:  Patient tolerated treatment well Patient left: with call bell/phone within reach;in chair;with chair alarm set;with family/visitor present Nurse Communication: Mobility status PT Visit Diagnosis: Other abnormalities of gait and mobility (R26.89)     Time: 8868-8848 PT Time Calculation (min) (ACUTE ONLY): 20 min  Charges:    $Gait Training: 8-22 mins PT General Charges $$ ACUTE PT VISIT: 1 Visit                     Lenoard SQUIBB, PT Acute Rehabilitation Services Office: [phone]    Lenoard NOVAK Katheren Jimmerson 04/07/2024, 12:07 PM

## 2024-04-07 NOTE — TOC Transition Note (Signed)
 Transition of Care (TOC) - Discharge Note Rayfield Gobble RN,BSN Inpatient Care Management Unit 4NP (Non Trauma)- RN Case Manager See Treatment Team for direct Phone # [phone] Holiday coverage  Patient Details  Name: Allison Schmidt MRN: [id number] Date of Birth: 04-Mar-1946  Transition of Care [facility]) CM/SW Contact:  Gobble Rayfield Hurst, RN Phone Number: 04/07/2024, 2:31 PM   Clinical Narrative:    Pt stable for transition home, active with [facility] -RN/PT/OT prior to admit- new orders placed   Call made to [facility] liaison to confirm they can add SLP to previous services- liaison has confirmed and will follow for resumption of care.   No further needs noted   Final next level of care: Home w Home Health Services Barriers to Discharge: No Barriers Identified   Patient Goals and CMS Choice      Resumption of HH      Discharge Placement                 Home w/ [facility]      Discharge Plan and Services Additional resources added to the After Visit Summary for     Discharge Planning Services: CM Consult Post Acute Care Choice: Home Health, Resumption of Svcs/PTA Provider          DME Arranged: N/A DME Agency: NA       HH Arranged: RN, PT, OT, Speech Therapy, Nurse's Aide HH Agency: [facility] Date HH Agency Contacted: 04/07/24 Time HH Agency Contacted: 1202 Representative spoke with at [facility] Agency: Sherlean  Social Drivers of Health (SDOH) Interventions SDOH Screenings   Food Insecurity: No Food Insecurity (04/06/2024)  Housing: Low Risk (04/06/2024)  Transportation Needs: No Transportation Needs (04/06/2024)  Utilities: Not At Risk (04/06/2024)  Financial Resource Strain: Low Risk  (05/05/2021)   Received from [facility] visits prior to 06/16/2022.  Physical Activity: Inactive (05/05/2021)   Received from [facility] visits prior to 06/16/2022.  Social Connections: Moderately Integrated (04/06/2024)  Stress: No Stress  Concern Present (05/05/2021)   Received from [facility] visits prior to 06/16/2022.  Tobacco Use: Medium Risk (04/05/2024)     Readmission Risk Interventions    04/07/2024    2:31 PM  Readmission Risk Prevention Plan  Transportation Screening Complete  Home Care Screening Complete  Medication Review (RN CM) Complete

## 2024-04-07 NOTE — Plan of Care (Signed)
 I have personally reviewed the plan of care with the patient, patients husband Genette), grandson Clayborn), nephew Tatiana), and niece who is an [employer]. I have relayed the information reviewed by the Stroke MD and NP. All questions and concerns addressed. Discharge instructions will be reviewed with family at the bedside.

## 2024-04-07 NOTE — Discharge Summary (Signed)
 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  Patient Active Problem List   Diagnosis Date Noted   ICH (intracerebral hemorrhage) (HCC) 04/05/2024   Anxiety 02/06/2024   Cerebral atherosclerosis 02/06/2024   PAD (peripheral artery disease) 08/08/2023   Facial twitching 11/09/2020   Anxiety and depression 04/21/2014   Anemia of chronic disease 05/11/2013   Essential hypertension 05/11/2013   Esophageal reflux 09/20/2012   Aneurysm of middle cerebral artery 07/06/2011     Allergies as of 04/07/2024       Reactions   Codeine         Medication List     STOP taking these medications    clopidogrel 75 MG tablet Commonly known as: PLAVIX   docusate sodium  100 MG capsule Commonly known as: COLACE   HYDROcodone -acetaminophen  5-325 MG tablet Commonly known as: NORCO/VICODIN   methocarbamol  500 MG tablet Commonly known as: ROBAXIN    methylPREDNISolone  4 MG Tbpk tablet Commonly known as: MEDROL  DOSEPAK   traMADol  50 MG tablet Commonly known as: ULTRAM        TAKE these medications    albuterol 108 (90 Base) MCG/ACT inhaler Commonly known as: VENTOLIN HFA Inhale into the lungs.   alendronate 70 MG tablet Commonly known as: FOSAMAX Take 70 mg by mouth once a week. Take with a full glass of water on an empty stomach.   allopurinol 100 MG tablet Commonly known as: ZYLOPRIM Take 100 mg by mouth daily.   atorvastatin  40 MG tablet Commonly known as: LIPITOR Take 40 mg by mouth daily.   azelastine 0.1 % nasal spray Commonly known as: ASTELIN Place into the nose.   carvedilol  12.5 MG tablet Commonly known as:  COREG  Take 12.5 mg by mouth 2 (two) times daily with a meal.   Centravites Tabs Take 1 tablet by mouth daily.   cholecalciferol 25 MCG (1000 UNIT) tablet Commonly known as: VITAMIN D3 Take 1,000 Units by mouth daily.   cinacalcet 30 MG tablet Commonly known as: SENSIPAR Take by mouth.   donepezil  5 MG tablet Commonly known as: ARICEPT  Take 5 mg by mouth at bedtime.   ferrous sulfate 325 (65 FE) MG tablet Take 1 tablet by mouth daily with breakfast.   gabapentin 100 MG capsule Commonly known as: NEURONTIN Take 100 mg by mouth daily as needed.   pantoprazole  40 MG tablet Commonly known as: PROTONIX  Take 40 mg by mouth daily.        LABORATORY STUDIES CBC    Component Value Date/Time   WBC 4.9 04/07/2024 0313   RBC 4.27 04/07/2024 0313   HGB 9.6 (L) 04/07/2024 0313   HCT 28.9 (L) 04/07/2024 0313   PLT 167 04/07/2024 0313   MCV 67.7 (L) 04/07/2024 0313   MCH 22.5 (L) 04/07/2024 0313   MCHC 33.2 04/07/2024 0313   RDW 17.1 (H) 04/07/2024 0313   LYMPHSABS 1.1 04/05/2024 1425   MONOABS 0.6 04/05/2024 1425   EOSABS 0.0 04/05/2024 1425   BASOSABS 0.0 04/05/2024 1425   CMP    Component Value Date/Time   NA 140 04/07/2024 0313   K 3.9 04/07/2024 0313   CL 105 04/07/2024  0313   CO2 25 04/07/2024 0313   GLUCOSE 77 04/07/2024 0313   BUN 24 (H) 04/07/2024 0313   CREATININE 1.04 (H) 04/07/2024 0313   CALCIUM  8.9 04/07/2024 0313   PROT 6.9 04/07/2024 0313   ALBUMIN 3.5 04/07/2024 0313   AST 29 04/07/2024 0313   ALT 24 04/07/2024 0313   ALKPHOS 61 04/07/2024 0313   BILITOT 0.4 04/07/2024 0313   GFRNONAA 54 (L) 04/07/2024 0313   COAGS Lab Results  Component Value Date   INR 1.1 04/05/2024   Lipid PanelNo results found for: CHOL, TRIG, HDL, CHOLHDL, VLDL, LDLCALC HgbA1C No results found for: HGBA1C Alcohol Level    Component Value Date/Time   [facility] <15 04/05/2024 1853     SIGNIFICANT DIAGNOSTIC STUDIES MR BRAIN W WO CONTRAST Result Date:  04/07/2024 EXAM: MRI BRAIN WITH AND WITHOUT CONTRAST 04/06/2024 10:26:44 PM TECHNIQUE: Multiplanar multisequence MRI of the head/brain was performed with and without the administration of intravenous contrast. CONTRAST: 4 mL of Gadavist . COMPARISON: MR Head without contrast 04/05/2024. CLINICAL HISTORY: Stroke, follow up. FINDINGS: BRAIN AND VENTRICLES: No acute infarct. Two hemorrhagic foci in the posterior right frontal and the right parietal lobe are unchanged and do not enhance. Multiple additional small foci of susceptibility artifact throughout the cortex are compatible with prior microhemorrhages. Remote left frontal infarct. Moderate T2 hyperintensities in the white matter, compatible with chronic microvascular ischemic change. No mass effect or midline shift. No hydrocephalus. The sella is unremarkable. Normal flow voids. No mass or abnormal enhancement. ORBITS: No acute abnormality. SINUSES: No acute abnormality. BONES AND SOFT TISSUES: Normal bone marrow signal and enhancement. No acute soft tissue abnormality. IMPRESSION: 1. Two hemorrhagic foci in the posterior right frontal and the right parietal lobe are unchanged. No abnormal enhancement to suggest metastases although acute blood products limits assessment. 2. No new/interval acute abnormality. 3. Moderate chronic microvascular ischemic change. 4. Many prior predominantly cortical chronic microhemorrahges, which can be seen with amyloid angiopathy Electronically signed by: Gilmore Molt 04/07/2024 02:21 AM EST RP Workstation: [id number]   CT CHEST ABDOMEN PELVIS W CONTRAST Result Date: 04/06/2024 EXAM: CT CHEST, ABDOMEN AND PELVIS WITH CONTRAST 04/06/2024 03:48:44 PM TECHNIQUE: CT of the chest, abdomen and pelvis was performed with the administration of 75 mL of intravenous iohexol  (OMNIPAQUE ) 350 MG/ML injection. Multiplanar reformatted images are provided for review. Automated exposure control, iterative reconstruction, and/or weight based  adjustment of the mA/kV was utilized to reduce the radiation dose to as low as reasonably achievable. COMPARISON: CT 04/03/2021 and 01/24/2023. CLINICAL HISTORY: Malignancy screening. FINDINGS: CHEST: MEDIASTINUM AND LYMPH NODES: Chronic small pericardial effusion. The central airways are clear. No mediastinal, hilar or axillary lymphadenopathy. LUNGS AND PLEURA: Emphysema. No focal consolidation or pulmonary edema. No pleural effusion or pneumothorax. ABDOMEN AND PELVIS: LIVER: The liver is unremarkable. GALLBLADDER AND BILE DUCTS: Focal fat deposition along the gallbladder fossa. No biliary ductal dilatation. SPLEEN: No acute abnormality. PANCREAS: No acute abnormality. ADRENAL GLANDS: No acute abnormality. KIDNEYS, URETERS AND BLADDER: No stones in the kidneys or ureters. No hydronephrosis. No perinephric or periureteral stranding. Urinary bladder is unremarkable. GI AND BOWEL: Stomach demonstrates no acute abnormality. There is no bowel obstruction. Colonic diverticulosis without evidence of diverticulitis. Similar wall thickening of the distal descending and sigmoid colon favored due to smooth muscle hypertrophy however colonoscopy is recommended if not performed recently to exclude underlying mucosal lesion. REPRODUCTIVE ORGANS: No acute abnormality. PERITONEUM AND RETROPERITONEUM: Small volume free fluid in the pelvis. No free air. VASCULATURE: Aorta is normal  in caliber. Aortic and coronary artery atherosclerotic calcifications in the chest. ABDOMINAL AND PELVIS LYMPH NODES: No lymphadenopathy. BONES AND SOFT TISSUES: Posterior fusion L3-S1. No focal soft tissue abnormality. IMPRESSION: 1. No acute findings. 2. Colonic diverticulosis without evidence of diverticulitis. Similar wall thickening of the distal descending and sigmoid colon favored due to smooth muscle hypertrophy however colonoscopy is recommended if not performed recently to exclude underlying mucosal lesion. 3. Emphysema. Pulmonary emphysema is  an independent risk factor for lung cancer. Recommend consideration for evaluation for a low-dose CT lung cancer screening program. Electronically signed by: Norman Gatlin MD 04/06/2024 11:21 PM EST RP Workstation: [id number]   EEG adult Result Date: 04/06/2024 Shelton Arlin KIDD, MD     04/06/2024  3:58 PM Patient Name: Auset Fritzler MRN: [id number] Epilepsy Attending: Arlin KIDD Shelton Referring Physician/Provider: Waddell Karna LABOR, NP Date: 04/06/2024 Duration: 21.49 mins Patient history:  78 y.o. female with severe headache and continued altered mental status. EEG to evaluate for seizure Level of alertness: Awake AEDs during EEG study: None Technical aspects: This EEG study was done with scalp electrodes positioned according to the 10-20 International system of electrode placement. Electrical activity was reviewed with band pass filter of 1-70Hz , sensitivity of 7 uV/mm, display speed of 14mm/sec with a 60Hz  notched filter applied as appropriate. EEG data were recorded continuously and digitally stored.  Video monitoring was available and reviewed as appropriate. Description: The posterior dominant rhythm consists of 8-9 Hz activity of moderate voltage (25-35 uV) seen predominantly in posterior head regions, symmetric and reactive to eye opening and eye closing. Hyperventilation and photic stimulation were not performed.   IMPRESSION: This study is within normal limits. No seizures or epileptiform discharges were seen throughout the recording. A normal interictal EEG does not exclude the diagnosis of epilepsy. Priyanka KIDD Shelton   CT HEAD POST STROKE FOLLOWUP/TIMED/STAT READ Result Date: 04/05/2024 CLINICAL DATA:  Follow-up examination for stroke. EXAM: CT HEAD WITHOUT CONTRAST TECHNIQUE: Contiguous axial images were obtained from the base of the skull through the vertex without intravenous contrast. RADIATION DOSE REDUCTION: This exam was performed according to the departmental dose-optimization program which  includes automated exposure control, adjustment of the mA and/or kV according to patient size and/or use of iterative reconstruction technique. COMPARISON:  Comparison made with prior CT and MRI from earlier the same day. FINDINGS: Brain: Previously identified 7 mm parenchymal hemorrhage involving the cortical subcortical right frontal lobe again seen, stable from prior (series 4, image 26). Minimal surrounding edema without significant regional mass effect. Additional focus involving the right parietal lobe seen on prior MRI not visible by CT. No new intracranial hemorrhage. No other acute large vessel territory infarct. No mass lesion or midline shift. No hydrocephalus or extra-axial fluid collection. 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 Vascular: No abnormal hyperdense vessel. Scattered vascular calcifications noted within the carotid siphons. Skull: Scalp soft tissues demonstrate no acute finding. Calvarium intact. Sinuses/Orbits: Globes orbital soft tissues within normal limits. Paranasal sinuses and mastoid air cells remain clear. Other: None. IMPRESSION: 1. Stable 7 mm parenchymal hemorrhage involving the cortical to subcortical right frontal lobe. No significant regional mass effect. 2. Additional focus involving the right parietal lobe seen on prior MRI not visible by CT. 3. No other new acute intracranial abnormality. 4. 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 Electronically Signed   By: Morene Hoard M.D.   On: 04/05/2024 21:27   MR BRAIN WO  CONTRAST Result Date: 04/05/2024 EXAM: MRI BRAIN WITHOUT CONTRAST 04/05/2024 06:11:28 PM TECHNIQUE: Multiplanar multisequence MRI of the head/brain was performed without the administration of intravenous contrast. COMPARISON: CT head without contrast 04/05/2024 and CT angio head and neck 04/04/2024. CLINICAL HISTORY: Neuro deficit,  acute, stroke suspected. FINDINGS: BRAIN AND VENTRICLES: No acute infarct. 2 hemorrhagic foci are present in the right hemisphere. The larger corresponds with the lesion identified in the posterior right frontal lobe at CT. A second cortical or subcortical hemorrhagic focus is present in the right parietal lobe. Remote infarct in the anterior left frontal lobe demonstrates some blood products. Periventricular and subcortical T2 hyperintensities are otherwise moderately advanced for age. No mass. No midline shift. No hydrocephalus. The sella is unremarkable. Normal flow voids. ORBITS: Insert lens replacement. SINUSES AND MASTOIDS: A right mastoid effusion is present. No obstructing nasopharyngeal lesion is present. BONES AND SOFT TISSUES: Normal marrow signal. No acute soft tissue abnormality. IMPRESSION: 1. Hemorrhagic foci in the right frontal and right parietal lobes, with the larger right frontal focus corresponding to the lesion identified on prior CT. The differential diagnosis includes small infarcts with interval hemorrhagic conversion (most likely) and metastatic disease. 2. Remote infarct in the anterior left frontal lobe with some blood products. 3. Moderately advanced ventricular and subcortical T2 hyperintensities for age. 4. When the patient's condition allows, repeat MRI with and without contrast may be useful for further evaluation. Electronically signed by: Lonni Necessary MD 04/05/2024 06:27 PM EST RP Workstation: [id number]   CT HEAD WO CONTRAST Result Date: 04/05/2024 CLINICAL DATA:  Headache hypertension EXAM: CT HEAD WITHOUT CONTRAST TECHNIQUE: Contiguous axial images were obtained from the base of the skull through the vertex without intravenous contrast. RADIATION DOSE REDUCTION: This exam was performed according to the departmental dose-optimization program which includes automated exposure control, adjustment of the mA and/or kV according to patient size and/or use of iterative  reconstruction technique. COMPARISON:  CT 04/04/2024 FINDINGS: Brain: Interim development of focal hyperdensity within the subcortical right frontal lobe, this measures 6.7 x 7 x 6.8 mm on series 3, image 26 and coronal series 5, image 33. Mild surrounding hypodense edema. No significant mass effect or midline shift. Atrophy and moderate chronic small vessel ischemic changes of the white matter. Bilateral basal ganglial calcifications. Stable ventricle size. Vascular: No hyperdense vessels. Carotid and vascular calcification Skull: No fracture Sinuses/Orbits: No acute finding. Other: None IMPRESSION: 1. Interim development of 7 mm focal hyperdensity / hemorrhage within the subcortical right frontal lobe with mild surrounding edema. No significant mass effect or midline shift. 2. Atrophy and chronic small vessel ischemic changes of the white matter. Critical Value/emergent results were called by telephone at the time of interpretation on 04/05/2024 at 3:20 pm to provider nurse Caitlyn for [facility] , who verbally acknowledged these results. Electronically Signed   By: Luke Bun M.D.   On: 04/05/2024 15:21   DG Chest Portable 1 View Result Date: 04/05/2024 CLINICAL DATA:  Altered mental status. EXAM: PORTABLE CHEST 1 VIEW COMPARISON:  Chest radiograph dated 04/04/2024. FINDINGS: No focal consolidation, pleural effusion, pneumothorax. The cardiac silhouette is within normal limits. Atherosclerotic calcification of the aorta. No acute osseous pathology. IMPRESSION: No active disease. Electronically Signed   By: Vanetta Chou M.D.   On: 04/05/2024 14:34   CT ANGIO HEAD NECK W WO CM Result Date: 04/04/2024 EXAM: CTA Head and Neck with Intravenous Contrast. CT Head without Contrast. CLINICAL HISTORY: headache, hx of aneurysm TECHNIQUE: Axial CTA images of the head  and neck performed with intravenous contrast. MIP reconstructed images were created and reviewed. Axial computed tomography images of the  head/brain performed without intravenous contrast. Note: Per PQRS, the description of internal carotid artery percent stenosis, including 0 percent or normal exam, is based on [employer] Symptomatic Carotid Endarterectomy Trial (NASCET) criteria. Dose reduction technique was used including one or more of the following: automated exposure control, adjustment of mA and kV according to patient size, and/or iterative reconstruction. 75 mL of iohexol  (OMNIPAQUE ) 350 MG/ML injection was administered intravenously. CONTRAST: 75 ml omnipaque  350 COMPARISON: CT head without contrast 01/06/19. CT angio head 01/06/19. MR head without and with contrast 08/29/20. FINDINGS: CT HEAD: BRAIN: Moderate periventricular and scattered subcortical white matter hypoattenuation demonstrates some progression bilaterally. No acute intraparenchymal hemorrhage. No mass lesion. No CT evidence for acute territorial infarct. No midline shift or extra-axial collection. VENTRICLES: No hydrocephalus. ORBITS: The orbits are unremarkable. SINUSES AND MASTOIDS: The paranasal sinuses and mastoid air cells are clear. CTA NECK: COMMON CAROTID ARTERIES: Mural calcifications are present in the distal right common carotid artery and at the right carotid bifurcation without significant stenosis relative to the more distal vessel. Diffuse mural calcification are present in the left common carotid artery. Atherosclerotic changes are present at the left carotid bifurcation without significant stenosis. No dissection or occlusion. INTERNAL CAROTID ARTERIES: Atherosclerotic calcifications are present within the cavernous internal carotid arteries bilaterally without focal stenosis. No stenosis by NASCET criteria. No dissection or occlusion. VERTEBRAL ARTERIES: The vertebral arteries are codominant. Dense calcification is present in the proximal left vertebral artery with at least 50% stenosis. No dissection or occlusion. 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 No significant stenosis. No occlusion. MIDDLE CEREBRAL ARTERIES: A 2 mm proximal right M2 segment aneurysm is stable on image 80 of series 13. No significant stenosis. No occlusion. POSTERIOR CEREBRAL ARTERIES: No significant stenosis. No occlusion. No aneurysm. BASILAR ARTERY: No significant stenosis. No occlusion. No aneurysm. OTHER: Dense atherosclerotic calcifications are present at the aortic arch and great vessel origins without focal stenosis or aneurysm. No aortic dissection, intramural hematoma, aortic ulceration, or rupture is identified. SOFT TISSUES: Residual right thyroid. No acute finding. No masses or lymphadenopathy. BONES: No acute osseous abnormality. IMPRESSION: 1. 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 2. Dense calcification in the proximal left vertebral artery with at least 50% stenosis. 3. Moderate periventricular and scattered subcortical white matter hypoattenuation demonstrates some progression bilaterally. Electronically signed by: Lonni Necessary MD 04/04/2024 02:57 PM EST RP Workstation: [id number]   DG Chest Port 1 View Result Date: 04/04/2024 CLINICAL DATA:  Weakness. EXAM: PORTABLE CHEST 1 VIEW COMPARISON:  Chest radiograph dated 08/10/2019. FINDINGS: No focal consolidation, pleural effusion or pneumothorax. Mild cardiomegaly. Atherosclerotic calcification of the aorta. No acute osseous pathology. IMPRESSION: 1. No active disease. 2. Mild cardiomegaly. Electronically Signed   By: Vanetta Chou M.D.   On: 04/04/2024 14:26       HISTORY OF PRESENT ILLNESS 78 y.o. patient with history of istory of  hx of R frontal infarct a in October (on DAPT therapy) known aneurysms, HTN, HLD, Dementia (Aricept  started in October), PAD (on chronic DAPT for this) who was BIB family to ED d/t severe headache and continued altered mental  status.  NIH on Admission 13   HOSPITAL COURSE Intracerebral Hemorrhage:  right frontal lobe with mild edema  Etiology: Concerning for cerebral amyloid  angiopathy, hypertensive and less likely metastasis due to negative screening  CT head 1. Interim development of 7 mm focal hyperdensity / hemorrhage within the subcortical right frontal lobe with mild surrounding edema. No significant mass effect or midline shift.2. Atrophy and chronic small vessel ischemic changes of the white matter. MRI  1. Hemorrhagic foci in the right frontal and right parietal lobes, with the larger right frontal focus corresponding to the lesion identified on prior CT. The differential diagnosis includes small infarcts with interval hemorrhagic conversion (most likely) and metastatic disease.2. Remote infarct in the anterior left frontal lobe with some blood products. 3. Moderately advanced ventricular and subcortical T2 hyperintensities for age. Repeat CT head stable Will repeat MRI brain with and without due to a lot of motion artifact Repeat MRI 1. Two hemorrhagic foci in the posterior right frontal and the right parietal lobe are unchanged. No abnormal enhancement to suggest metastases although acute blood products limits assessment. 2. No new/interval acute abnormality. 3. Moderate chronic microvascular ischemic change. 4. Many prior predominantly cortical chronic microhemorrahges, which can be seen with amyloid angiopathy Recommend repeating MRI with and without contrast in 6 to 8 weeks  CT chest abdomen pelvis with contrast negative for malignancy 2D Echo 01/2024-EF 40 to 45% HgbA1c 01/2024 5.4 aspirin  81 mg daily and clopidogrel 75 mg daily prior to admission, now on No antithrombotic due to ICH Therapy recommendations: Home with home health    Hx of Stroke/TIA Middle cerebral aneurysm Insomnia Anxiety and depression Dementia Left facial twitching 01/2024 cortical posterior right frontal infarct, workup  completed at [facility] Continue home Aricept  Routine EEG 04/06/2024 normal   Hypertension Home meds: Coreg  12.5 mg, SBP goal normotensive   Hyperlipidemia Home meds: Atorvastatin  40 mg,  resumed in hospital LDL 100 goal < 70 (10/25) Per chart review patient unable to tolerate higher doses of statin Continue statin at discharge  Other Stroke Risk Factors Elderly   Other Active Problems Thalassemia Iron deficient anemia GERD PAD   DISCHARGE EXAM  PHYSICAL EXAM Mental Status: AA&Ox3, patient is able to give clear and coherent history Speech/Language: speech is without dysarthria or aphasia.  Naming, repetition, fluency, and comprehension intact.   Cranial Nerves:  II: PERRL. Visual fields full.  III, IV, VI: EOMI. Eyelids elevate symmetrically.  V: Sensation is intact to light touch and symmetrical to face.  VII: Left facial droop, left rhythmic facial twitching noted VIII: hearing intact to voice. IX, X: Palate elevates symmetrically. Phonation is normal.  KP:Dynloizm shrug 5/5. XII: tongue is midline without fasciculations. Motor: 5/5 strength to all muscle groups tested.  Tone: is normal and bulk is normal Sensation- Intact to light touch bilaterally. Extinction absent to light touch to DSS.   Coordination: FTN intact bilaterally, HKS: no ataxia in BLE.No drift.  Gait- deferred  Discharge Diet       Diet   Diet regular Room service appropriate? Yes; Fluid consistency: Thin   liquids  DISCHARGE PLAN Disposition: Home with Home Health No antithrombotic due to recent ICH due to CAA Ongoing stroke risk factor control by Primary Care Physician at time of discharge Follow-up PCP Jolee Madelin Patch, MD in 2 weeks. Follow-up in [facility] in 8 weeks, office to schedule an appointment. Able to see NP in clinic.  50 minutes were spent preparing discharge.   Karna Geralds DNP, ACNPC-AG  [facility]

## 2024-06-29 ENCOUNTER — Ambulatory Visit: Admitting: Diagnostic Neuroimaging
